# Patient Record
Sex: Female | Born: 1970 | Race: White | Hispanic: No | Marital: Married | Smoking: Never smoker
Health system: Southern US, Community
[De-identification: ages and names within clinical notes are randomized; demographics above are authoritative.]

## PROBLEM LIST (undated history)

## (undated) DIAGNOSIS — I480 Paroxysmal atrial fibrillation: Secondary | ICD-10-CM

## (undated) DIAGNOSIS — G509 Disorder of trigeminal nerve, unspecified: Principal | ICD-10-CM

## (undated) HISTORY — DX: Disorder of trigeminal nerve, unspecified: G50.9

## (undated) HISTORY — DX: Paroxysmal atrial fibrillation: I48.0

## (undated) HISTORY — PX: OTHER SURGICAL HISTORY: SHX169

## (undated) HISTORY — PX: TYMPANOSTOMY TUBE PLACEMENT: SHX32

---

## 1999-12-06 ENCOUNTER — Other Ambulatory Visit: Admission: RE | Admit: 1999-12-06 | Discharge: 1999-12-06 | Payer: Self-pay | Admitting: Obstetrics & Gynecology

## 2001-01-01 ENCOUNTER — Other Ambulatory Visit: Admission: RE | Admit: 2001-01-01 | Discharge: 2001-01-01 | Payer: Self-pay | Admitting: Obstetrics & Gynecology

## 2005-06-17 ENCOUNTER — Encounter: Admission: RE | Admit: 2005-06-17 | Discharge: 2005-06-17 | Payer: Self-pay | Admitting: Otolaryngology

## 2005-06-18 ENCOUNTER — Other Ambulatory Visit: Admission: RE | Admit: 2005-06-18 | Discharge: 2005-06-18 | Payer: Self-pay | Admitting: Family Medicine

## 2006-03-12 ENCOUNTER — Ambulatory Visit (HOSPITAL_COMMUNITY): Admission: RE | Admit: 2006-03-12 | Discharge: 2006-03-12 | Payer: Self-pay | Admitting: Certified Nurse Midwife

## 2006-03-19 ENCOUNTER — Ambulatory Visit (HOSPITAL_COMMUNITY): Admission: RE | Admit: 2006-03-19 | Discharge: 2006-03-19 | Payer: Self-pay | Admitting: Certified Nurse Midwife

## 2010-09-03 ENCOUNTER — Encounter: Payer: Self-pay | Admitting: Certified Nurse Midwife

## 2013-11-20 ENCOUNTER — Other Ambulatory Visit: Payer: Self-pay | Admitting: Family Medicine

## 2013-11-20 ENCOUNTER — Other Ambulatory Visit (HOSPITAL_COMMUNITY)
Admission: RE | Admit: 2013-11-20 | Discharge: 2013-11-20 | Disposition: A | Discharge status: Home / Self Care | Payer: 59 | Source: Ambulatory Visit | Attending: Family Medicine | Admitting: Family Medicine

## 2013-11-20 DIAGNOSIS — Z124 Encounter for screening for malignant neoplasm of cervix: Secondary | ICD-10-CM | POA: Insufficient documentation

## 2013-12-17 ENCOUNTER — Other Ambulatory Visit: Payer: Self-pay

## 2013-12-17 DIAGNOSIS — Z1231 Encounter for screening mammogram for malignant neoplasm of breast: Secondary | ICD-10-CM

## 2013-12-30 ENCOUNTER — Encounter (INDEPENDENT_AMBULATORY_CARE_PROVIDER_SITE_OTHER): Payer: Self-pay

## 2013-12-30 ENCOUNTER — Ambulatory Visit (INDEPENDENT_AMBULATORY_CARE_PROVIDER_SITE_OTHER): Payer: 59 | Admitting: Neurology

## 2013-12-30 ENCOUNTER — Encounter: Payer: Self-pay | Admitting: Neurology

## 2013-12-30 VITALS — BP 109/72 | HR 73 | Ht 69.0 in | Wt 158.0 lb

## 2013-12-30 DIAGNOSIS — G509 Disorder of trigeminal nerve, unspecified: Secondary | ICD-10-CM

## 2013-12-30 HISTORY — DX: Disorder of trigeminal nerve, unspecified: G50.9

## 2013-12-30 NOTE — Progress Notes (Signed)
Reason for visit: Right facial numbness  Kathryn Armstrong is a 43 y.o. female  History of present illness:  Kathryn Armstrong is a 43 year old right-handed white female with a history of right facial numbness that began in 2013 in October of that year. She indicated that she has had some numbness in the lower face on the right, but it has spread to go around her eye and into her forehead. She will have some right neck numbness at times as well, and at one point she had some numbness down into the upper shoulder on the right. The patient has not noted any weakness, speech changes, swallowing problems, or vision changes. She has not had any weakness of the extremities were numbness out into the arms or legs or problems with balance. She denies any problems controlling the bowels or the bladder. At times, the patient may have an uncomfortable sharp sensation that may be brought on by eating or coughing or sneezing. She has had gradual progression of the symptoms over time. She comes to this office for an evaluation.  Past Medical History  Diagnosis Date  . 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 Tympanostomy tube placement    . Reconstructive rhinoplasty  6440,3474    Family History  Problem Relation Age of Onset  . Colon cancer Father   . Heart disease Father   . Skin cancer Maternal Aunt   . Parkinsonism Paternal Grandfather     Social history:  reports that she has never smoked. She has never used smokeless tobacco. She reports that she drinks alcohol. She reports that she does not use illicit drugs.  Medications:  No current outpatient prescriptions on file prior to visit.   No current facility-administered medications on file prior to visit.     No Known Allergies  ROS:  Out of a complete 14 system review of symptoms, the patient complains only of the following symptoms, and all other reviewed systems are  negative.  Numbness of the face  Blood pressure 109/72, pulse 73, height 5\' 9"  (1.753 m), weight 158 lb (71.668 kg).  Physical Exam  General: The patient is alert and cooperative at the time of the examination.  Eyes: Pupils are equal, round, and reactive to light. Discs are flat bilaterally.  Neck: The neck is supple, no carotid bruits are noted.  Respiratory: The respiratory examination is clear.  Cardiovascular: The cardiovascular examination reveals a regular rate and rhythm, no obvious murmurs or rubs are noted.  Skin: Extremities are without significant edema.  Neurologic Exam  Mental status: The patient is alert and oriented x 3 at the time of the examination. The patient has apparent normal recent and remote memory, with an apparently normal attention span and concentration ability.  Cranial nerves: Facial symmetry is present. There is good sensation of the face to pinprick and soft touch on the left, decreased on the right. There is some decrease in pinprick sensation on the right neck is well. The strength of the facial muscles and the muscles to head turning and shoulder shrug are normal bilaterally. Speech is well enunciated, no aphasia or dysarthria is noted. Extraocular movements are full. Visual fields are full. The tongue is midline, and the patient has symmetric elevation of the soft palate. No obvious hearing deficits are noted.  Motor: The motor testing reveals 5 over 5 strength of all 4 extremities. Good symmetric motor tone is noted throughout.  Sensory: Sensory  testing is intact to pinprick, soft touch, vibration sensation, and position sense on all 4 extremities. No evidence of extinction is noted.  Coordination: Cerebellar testing reveals good finger-nose-finger and heel-to-shin bilaterally.  Gait and station: Gait is normal. Tandem gait is normal. Romberg is negative. No drift is seen.  Reflexes: Deep tendon reflexes are symmetric and normal bilaterally.  Toes are downgoing bilaterally.   Assessment/Plan:  1. Atypical facial pain, right  The patient has a history that is not typical for trigeminal neuralgia. She reports a true numbness of the right face and the right neck. The patient has some discomfort in the right face at times, but she does not believe that the discomfort is significant enough that she would want medications for it at this time. The patient will require a workup to include MRI evaluation of the brain with and without gadolinium enhancement to exclude demyelinating disease or external compression of the trigeminal nerve. The patient will followup through this office if needed.  Jill Alexanders MD 12/30/2013 7:56 PM  [facility] [address] [place], [place] [address]  Phone [phone] Fax [phone]

## 2014-01-07 ENCOUNTER — Ambulatory Visit
Admission: RE | Admit: 2014-01-07 | Discharge: 2014-01-07 | Disposition: A | Discharge status: Home / Self Care | Payer: 59 | Source: Ambulatory Visit

## 2014-01-07 DIAGNOSIS — Z1231 Encounter for screening mammogram for malignant neoplasm of breast: Secondary | ICD-10-CM

## 2014-01-11 ENCOUNTER — Other Ambulatory Visit: Payer: Self-pay | Admitting: Family Medicine

## 2014-01-11 DIAGNOSIS — R928 Other abnormal and inconclusive findings on diagnostic imaging of breast: Secondary | ICD-10-CM

## 2014-01-21 ENCOUNTER — Other Ambulatory Visit: Payer: Self-pay | Admitting: Family Medicine

## 2014-01-21 ENCOUNTER — Ambulatory Visit
Admission: RE | Admit: 2014-01-21 | Discharge: 2014-01-21 | Disposition: A | Discharge status: Home / Self Care | Payer: 59 | Source: Ambulatory Visit | Attending: Family Medicine | Admitting: Family Medicine

## 2014-01-21 DIAGNOSIS — R928 Other abnormal and inconclusive findings on diagnostic imaging of breast: Secondary | ICD-10-CM

## 2014-01-27 ENCOUNTER — Ambulatory Visit
Admission: RE | Admit: 2014-01-27 | Discharge: 2014-01-27 | Disposition: A | Discharge status: Home / Self Care | Payer: 59 | Source: Ambulatory Visit | Attending: Neurology | Admitting: Neurology

## 2014-01-27 DIAGNOSIS — G509 Disorder of trigeminal nerve, unspecified: Secondary | ICD-10-CM

## 2014-01-27 MED ORDER — GADOBENATE DIMEGLUMINE 529 MG/ML IV SOLN
15.0000 mL | Freq: Once | INTRAVENOUS | Status: AC | PRN
  Administered 2014-01-27: 15 mL via INTRAVENOUS

## 2014-01-29 ENCOUNTER — Telehealth: Payer: Self-pay | Admitting: Neurology

## 2014-01-29 DIAGNOSIS — D333 Benign neoplasm of cranial nerves: Secondary | ICD-10-CM

## 2014-01-29 NOTE — Telephone Encounter (Signed)
I called patient. 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 This was there in 2006, never called. The tumor has grown slowly over time. I discussed this with the patient, and we will make a referral for a neurosurgical evaluation.    MRI brain 01/28/14:  IMPRESSION:  Abnormal MRI brain (with and without) demonstrating:  1. 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 It appears multi-lobulated, with segments extending inferiorly and superiorly. There is some extension towards the right gasserian ganglion and right cavernous sinus. There is extension towards the cisternal segment of the 7th/8th cranial nerve complex. There is mass effect upon the right midbrain and right pons. 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  2. Compared to MRI on 06/17/05, the right cerebellopontine angle mass has grown in size. In retrospect, this lesion was present in the prior study, but smaller in size (1.0x0.5cm in prior study).

## 2014-02-01 ENCOUNTER — Telehealth: Payer: Self-pay | Admitting: Neurology

## 2014-02-01 ENCOUNTER — Encounter: Payer: Self-pay | Admitting: Neurology

## 2014-02-01 DIAGNOSIS — D329 Benign neoplasm of meninges, unspecified: Secondary | ICD-10-CM

## 2014-02-01 NOTE — Telephone Encounter (Signed)
Pt is requesting another referral, she does not want to have just one referral. Pt states with this involving a brain tumor and going into surgery she does not want to just have one referral. Please call pt back concerning this matter. Thanks

## 2014-02-01 NOTE — Telephone Encounter (Signed)
Please see prior telephone note, I have called the patient, set up a referral to [facility], she are has an appointment at [facility]. I cautioned her about seeing too many doctors for the same problem, this may generate confusion concerning the appropriate method of treatment.

## 2014-02-01 NOTE — Telephone Encounter (Signed)
I called patient. The patient is going to be seen at [facility], I'll set up referral to [facility] as per her request. She will be seen locally through [facility] as well.

## 2014-02-01 NOTE — Telephone Encounter (Signed)
Referral to [facility] was made patient is scheduled for 02/04/14. She called this morning stating she wants multiple referrals. She sent you a my chart message with several doctors. She says [facility] is  Vascular neurosurgeon and that's not what she needs. I have sent to other referral (1) [facility] Dr. Marjory Sneddon and (2) Renee Harder @ [place].

## 2014-02-03 NOTE — Telephone Encounter (Signed)
Pt called back states she needs Dr. Jannifer Franklin to send a referral to [facility]. They have already recd information from her insurance and request we call Alphia Moh at [phone] regarding setting up an apt w/Dr. Alycia Patten. Any questions please call pt concerning this matter or call Brandy concerning the referral. Thanks

## 2014-02-04 ENCOUNTER — Encounter: Payer: Self-pay | Admitting: Neurology

## 2014-02-04 NOTE — Telephone Encounter (Signed)
Kathryn Armstrong at PCP requesting a call with appointment time and date for Kathryn Armstrong and Kathryn Armstrong.  She needing to do referral for Insurance purposes.Marland KitchenMarland KitchenPlease call her @ [phone].

## 2014-02-04 NOTE — Telephone Encounter (Signed)
Called patient she has already been seen by [facility] on yesterday. She has appointment with Dr.Tatter @ [facility] on 02/18/14.  Spoke with Beverlee Nims at PCP to let her know of Dr. Renee Harder [facility]. Patient  Is now satisfied with referral status.

## 2014-05-28 ENCOUNTER — Other Ambulatory Visit: Payer: Self-pay

## 2014-10-27 ENCOUNTER — Other Ambulatory Visit (HOSPITAL_COMMUNITY): Payer: Self-pay | Admitting: Family Medicine

## 2014-10-27 DIAGNOSIS — Z1231 Encounter for screening mammogram for malignant neoplasm of breast: Secondary | ICD-10-CM

## 2014-11-15 ENCOUNTER — Other Ambulatory Visit: Payer: Self-pay | Admitting: Family Medicine

## 2014-11-15 DIAGNOSIS — R921 Mammographic calcification found on diagnostic imaging of breast: Secondary | ICD-10-CM

## 2015-01-11 ENCOUNTER — Other Ambulatory Visit: Payer: Self-pay | Admitting: Family Medicine

## 2015-01-11 ENCOUNTER — Ambulatory Visit
Admission: RE | Admit: 2015-01-11 | Discharge: 2015-01-11 | Disposition: A | Discharge status: Home / Self Care | Payer: BLUE CROSS/BLUE SHIELD | Source: Ambulatory Visit | Attending: Family Medicine | Admitting: Family Medicine

## 2015-01-11 ENCOUNTER — Ambulatory Visit
Admission: RE | Admit: 2015-01-11 | Discharge: 2015-01-11 | Disposition: A | Discharge status: Home / Self Care | Payer: 59 | Source: Ambulatory Visit | Attending: Family Medicine | Admitting: Family Medicine

## 2015-01-11 DIAGNOSIS — R921 Mammographic calcification found on diagnostic imaging of breast: Secondary | ICD-10-CM

## 2015-01-11 DIAGNOSIS — N6310 Unspecified lump in the right breast, unspecified quadrant: Secondary | ICD-10-CM

## 2015-01-12 ENCOUNTER — Ambulatory Visit (HOSPITAL_COMMUNITY): Payer: 59

## 2015-02-22 ENCOUNTER — Ambulatory Visit: Payer: BLUE CROSS/BLUE SHIELD | Attending: Family Medicine | Admitting: Physical Therapy

## 2015-02-22 DIAGNOSIS — H832X9 Labyrinthine dysfunction, unspecified ear: Secondary | ICD-10-CM | POA: Insufficient documentation

## 2015-02-22 DIAGNOSIS — H532 Diplopia: Secondary | ICD-10-CM | POA: Diagnosis present

## 2015-02-22 DIAGNOSIS — R269 Unspecified abnormalities of gait and mobility: Secondary | ICD-10-CM | POA: Insufficient documentation

## 2015-02-23 ENCOUNTER — Encounter: Payer: Self-pay | Admitting: Physical Therapy

## 2015-02-23 NOTE — Therapy (Signed)
[facility] [address] [place], [place], [address] Phone: [phone]   Fax:  [phone]  Physical Therapy Evaluation  Patient Details  Name: Avon Molock MRN: [id number] Date of Birth: 1970/10/15 Referring Provider:  Kelton Pillar, MD  Encounter Date: 02/22/2015      PT End of Session - 02/23/15 2126    Visit Number 1   Number of Visits 1  pt reports she is going to be going to [place] for therapy   PT Start Time 1103   PT Stop Time 1146   PT Time Calculation (min) 43 min      Past Medical History  Diagnosis Date  . 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 Tympanostomy tube placement    . Reconstructive rhinoplasty  2863,8177    There were no vitals filed for this visit.  Visit Diagnosis:  Diplopia - Plan: PT plan of care cert/re-cert  Balance problem due to vestibular dysfunction, unspecified laterality - Plan: PT plan of care cert/re-cert  Abnormality of gait - Plan: PT plan of care cert/re-cert      Subjective Assessment - 02/23/15 0730    Subjective Pt. 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 is wearing an eye patch over L and states her chief c/o is diplopia; pt. wishes to address visual deficits and has questions regarding when and how often she should wear her eye patch;  pt states she is waiting to hear back from [facility] as to whether she may be going there for therapy but came to this facility due to referral being made prior to her leaving hospital. Pt states she feels that balance has improved and is now decreased mostly due to her double vision. Pt. states she wants to address visual deficits as soon as possible and later address balance deficits after visual problems addressed (recommend OT for visual deficits)  eye    Patient Stated Goals Improve vision - address visual deficits and decrease diplopia   Currently in Pain? No/denies            [facility] PT Assessment - 02/23/15 0001    Assessment   Medical Diagnosis Meningioma (CPA tumor)   Onset Date/Surgical Date 02/16/15   Balance Screen   Has the patient fallen in the past 6 months No   Has the patient had a decrease in activity level because of a fear of falling?  No   Is the patient reluctant to leave their home because of a fear of falling?  No   Prior Function   Level of Independence Independent with household mobility without device;Independent with community mobility without device   Ambulation/Gait   Ambulation/Gait Yes   Ambulation Distance (Feet) 100 Feet   Assistive device --  None   Gait Pattern Ataxic     L eye is not symmetrical with R eye and does not appear to converge as R eye does; unable to assess dynamic  visual acuity due to double vision Pt. Appears to have vestibular deficits in addition to diplopia but wishes to first address her visual problem as she states  This is her chief c/o   Pt. Amb. Modified independently without device but occasionally holding wall due to unsteady gait which pt attributes to her Double vision       I talked to pt. on 02-23-15 and she states she is going to [facility] for therapy for her visual deficits            PT Education - 02/23/15 2125    Education provided Yes   Education Details Instructed pt to cont with saccades exercises - horizontal and vertical   Person(s) Educated Patient   Methods Explanation;Demonstration   Comprehension Verbalized understanding;Returned  demonstration                    Plan - 02/23/15 2127    Clinical Impression Statement Pt. presents with chief c/o diplopia with use of eye patch over L eye; unsteady gait noted but pt states this is due to double vision; pt. appears to have vestibular deficits in addition to visual deficits; pt reported on 02-23-15 (day after eval) that she will be going to [facility] for therapy                                                                                                                                            PT Frequency 1x / week  eval only per pt's request   PT Next Visit Plan N/A - eval only         Problem List Patient Active Problem List   Diagnosis Date Noted  . 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

## 2015-04-26 ENCOUNTER — Telehealth: Payer: Self-pay | Admitting: Cardiovascular Disease

## 2015-04-26 NOTE — Telephone Encounter (Signed)
Received records from [facility] for appointment with Dr Oval Linsey on 05/11/15.  Records given to [facility] (medical records) for Dr Blenda Mounts schedule on 05/11/15. lp

## 2015-05-10 NOTE — Progress Notes (Signed)
Cardiology Office Note   Date:  05/11/2015   ID:  Kathryn Armstrong, DOB Jun 08, 1971, MRN [id number]  PCP:  Osborne Casco, MD  Cardiologist:   Sharol Harness, MD   Chief Complaint  Patient presents with  . Establish Care  . Dizziness    DUE TO NERVE PROBLEMS      History of Present Illness: Kathryn Armstrong is a 44 y.o. female with atrial fibrillation and a meningioma who presents for an evaluation of peri-operative atrial fibrillation.  Kathryn Armstrong had a meningioma removed in July. During the surgery she had a run of atrial fibrillation. It is unclear how this was managed from the notes, however she does not remember having any new medications at discharge and does not think that she underwent DC cardioversion. She had an echo that showed normal systolic function and mild enlargement of the left atrium. Her thyroid function and electrolytes were stable. She was not anemic. She did not take any rate control or blood thinning agents at discharge. Since she left the hospital she has not noted any palpitations, chest pain, shortness of breath, lightheadedness, dizziness, lower extremity edema, orthopnea or PND. 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  Kathryn Armstrong does not get any formal exercise. She has a hard time running due to diplopia. She does try to walk regularly and has no symptoms when trying to lift her 90 pound child. She is even able to give them piggyback rides while walking up stairs.  Kathryn Armstrong is scheduled for a breast lumpectomy on October 10. She was asked to be evaluated by cardiology prior to this procedure.    Past Medical History  Diagnosis Date  . 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 Episodic atrial fibrillation     perioperative, 02/2015    Past Surgical History  Procedure Laterality Date  . Tympanostomy tube placement    . Reconstructive  rhinoplasty  3212,2482     No current outpatient prescriptions on file.   No current facility-administered medications for this visit.    Allergies:   Review of patient's allergies indicates no known allergies.    Social History:  The patient  reports that she has never smoked. She has never used smokeless tobacco. She reports that she drinks alcohol. She reports that she does not use illicit drugs.   Family History:  The patient's family history includes Colon cancer in her father; Heart disease in her father; Parkinsonism in her paternal grandfather; Skin cancer in her maternal aunt.    ROS:  Please see the history of present illness.   Otherwise, review of systems are positive for none.   All other systems are reviewed and negative.    PHYSICAL EXAM: VS:  BP 106/80 mmHg  Pulse 77  Ht 5\' 9"  (1.753 m)  Wt 68.04 kg (150 lb)  BMI 22.14 kg/m2 , BMI Body mass index is 22.14 kg/(m^2). GENERAL:  Well appearing HEENT:  Pupils equal round and reactive, fundi not visualized, oral mucosa unremarkable NECK:  No jugular venous distention, waveform within normal limits, carotid upstroke brisk and symmetric, no bruits, no thyromegaly LYMPHATICS:  No cervical adenopathy LUNGS:  Clear to auscultation bilaterally HEART:  RRR.  PMI not displaced or sustained,S1 and S2 within normal limits, no S3, no S4, no clicks, no rubs, no murmurs ABD:  Flat, positive bowel sounds normal in frequency in pitch, no bruits, no rebound, no  guarding, no midline pulsatile mass, no hepatomegaly, no splenomegaly EXT:  2 plus pulses throughout, no edema, no cyanosis no clubbing SKIN:  No rashes no nodules NEURO:  Cranial nerves II through XII grossly intact, motor grossly intact throughout PSYCH:  Cognitively intact, oriented to person place and time    EKG:  EKG is ordered today. The ekg ordered today demonstrates sinus rhythm at 77 bpm.  Low voltage in the precordial leads.   Recent Labs: No results found for  requested labs within last 365 days.    Lipid Panel No results found for: CHOL, TRIG, HDL, CHOLHDL, VLDL, LDLCALC, LDLDIRECT    Wt Readings from Last 3 Encounters:  05/11/15 68.04 kg (150 lb)  12/30/13 71.668 kg (158 lb)    Echo 02/17/15:  LVEF >55%.  No wall motion abnormalities.  Trivial MR, TR, PR.  LA mildly enlarged.    TSH 0.58   Other studies Reviewed: Additional studies/ records that were reviewed today include: . Review of the above records demonstrates:  Please see elsewhere in the note.     ASSESSMENT AND PLAN:  # Peri-operative atrial fibrillation: [name] had an episode of perioperative atrial fibrillation. It does not appear as though she's had any recurrent symptoms since that time. She was not on anticoagulation presumably due to her low CHADS2-VASc score (1 for female gender) and high bleeding risk after brain surgery. Given that all her labs were within normal limits, we will not repeat them at this time. She does not need any further evaluation and she is asymptomatic and had normal labs with no clear recurrent episodes. Should she have recurrent atrial fibrillation with her next surgery, we will be happy to assist with her management.   Current medicines are reviewed at length with the patient today.  The patient does not have concerns regarding medicines.  The following changes have been made:  no change  Labs/ tests ordered today include:  No orders of the defined types were placed in this encounter.     Disposition:   FU with Kathryn C. Oval Linsey, MD as needed.    Signed, Sharol Harness, MD  05/11/2015 8:59 AM    [place]

## 2015-05-11 ENCOUNTER — Encounter: Payer: Self-pay | Admitting: Cardiovascular Disease

## 2015-05-11 ENCOUNTER — Ambulatory Visit (INDEPENDENT_AMBULATORY_CARE_PROVIDER_SITE_OTHER): Payer: BLUE CROSS/BLUE SHIELD | Admitting: Cardiovascular Disease

## 2015-05-11 VITALS — BP 106/80 | HR 77 | Ht 69.0 in | Wt 150.0 lb

## 2015-05-11 DIAGNOSIS — I480 Paroxysmal atrial fibrillation: Secondary | ICD-10-CM | POA: Diagnosis not present

## 2015-05-11 NOTE — Patient Instructions (Signed)
Dr Lanare recommends that you follow-up with her as needed. 

## 2015-12-27 ENCOUNTER — Other Ambulatory Visit: Payer: Self-pay | Admitting: Orthopaedic Surgery

## 2015-12-27 DIAGNOSIS — M25512 Pain in left shoulder: Secondary | ICD-10-CM

## 2016-01-01 ENCOUNTER — Ambulatory Visit
Admission: RE | Admit: 2016-01-01 | Discharge: 2016-01-01 | Disposition: A | Discharge status: Home / Self Care | Payer: BLUE CROSS/BLUE SHIELD | Source: Ambulatory Visit | Attending: Orthopaedic Surgery | Admitting: Orthopaedic Surgery

## 2016-01-01 DIAGNOSIS — M25512 Pain in left shoulder: Secondary | ICD-10-CM

## 2016-07-16 ENCOUNTER — Ambulatory Visit (INDEPENDENT_AMBULATORY_CARE_PROVIDER_SITE_OTHER): Payer: BLUE CROSS/BLUE SHIELD | Admitting: Orthopaedic Surgery

## 2016-07-16 ENCOUNTER — Encounter (INDEPENDENT_AMBULATORY_CARE_PROVIDER_SITE_OTHER): Payer: Self-pay | Admitting: Orthopaedic Surgery

## 2016-07-16 DIAGNOSIS — M7592 Shoulder lesion, unspecified, left shoulder: Secondary | ICD-10-CM | POA: Diagnosis not present

## 2016-07-16 DIAGNOSIS — M7542 Impingement syndrome of left shoulder: Secondary | ICD-10-CM

## 2016-07-16 MED ORDER — BUPIVACAINE HCL 0.5 % IJ SOLN
3.0000 mL | INTRAMUSCULAR | Status: AC | PRN
  Administered 2016-07-16: 3 mL via INTRA_ARTICULAR

## 2016-07-16 MED ORDER — METHYLPREDNISOLONE ACETATE 40 MG/ML IJ SUSP
40.0000 mg | INTRAMUSCULAR | Status: AC | PRN
  Administered 2016-07-16: 40 mg via INTRA_ARTICULAR

## 2016-07-16 MED ORDER — LIDOCAINE HCL 1 % IJ SOLN
3.0000 mL | INTRAMUSCULAR | Status: AC | PRN
  Administered 2016-07-16: 3 mL

## 2016-07-16 NOTE — Progress Notes (Signed)
Office Visit Note   Patient: Kathryn Armstrong           Date of Birth: 06-Nov-1970           MRN: [id number] Visit Date: 07/16/2016              Requested by: Kelton Pillar, MD [address] [place], [place] [address] PCP: Osborne Casco, MD   Assessment & Plan: Visit Diagnoses: No diagnosis found.  Plan: MRI of the left shoulder from May was again reviewed which shows moderate tendinopathy of the supraspinatus tendon with partial articular surface tear. Also was showing signs of synovitis and acromioclavicular arthropathy with type II acromion. 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 I provided her with a subacromial injection like to see her back in 6 weeks to see if she is doing. We did briefly discuss arthroscopic evaluation and debridement  Follow-Up Instructions: Return in about 6 weeks (around 08/27/2016).   Orders:  No orders of the defined types were placed in this encounter.  No orders of the defined types were placed in this encounter.     Procedures: Large Joint Inj Date/Time: 07/16/2016 2:16 PM Performed by: Leandrew Koyanagi Authorized by: Leandrew Koyanagi   Consent Given by:  Patient Timeout: prior to procedure the correct patient, procedure, and site was verified   Location:  Shoulder Site:  L subacromial bursa Prep: patient was prepped and draped in usual sterile fashion   Needle Size:  22 G Approach:  Posterior Ultrasound Guidance: No   Fluoroscopic Guidance: No   Arthrogram: No   Medications:  3 mL lidocaine 1 %; 3 mL bupivacaine 0.5 %; 40 mg methylPREDNISolone acetate 40 MG/ML     Clinical Data: No additional findings.   Subjective: Chief Complaint  Patient presents with  . Left Shoulder - Pain, Follow-up    Patient follows up today for continued left shoulder pain. She had double mastectomies for weeks ago for breast cancer. She is doing well. She  complains of continued pain with certain movements like elevation of her arm and reaching back. Pain will sometimes radiate into the neck.    Review of Systems  Constitutional: Negative.   HENT: Negative.   Eyes: Negative.   Respiratory: Negative.   Cardiovascular: Negative.   Endocrine: Negative.   Musculoskeletal: Negative.   Neurological: Negative.   Hematological: Negative.   Psychiatric/Behavioral: Negative.   All other systems reviewed and are negative.    Objective: Vital Signs: There were no vitals taken for this visit.  Physical Exam Well-nourished no acute distress alert and 3 Ortho Exam Exam the left shoulder shows positive empty can testing. Also has pain with mild weakness of infraspinatus testing. Positive cross adduction and positive Hawkins test. Negative drop arm test. Range of motion is normal. Specialty Comments:  No specialty comments available.  Imaging: No results found.   PMFS History: Patient Active Problem List   Diagnosis Date Noted  . 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 Episodic atrial fibrillation ([place])    perioperative, 02/2015  . 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 Colon cancer Father   . Heart disease Father   . Skin cancer Maternal Aunt   . Parkinsonism Paternal Grandfather     Past Surgical History:  Procedure Laterality Date  . RECONSTRUCTIVE RHINOPLASTY  [id number]  . TYMPANOSTOMY TUBE PLACEMENT     Social History   Occupational History  . MARKETING     [employer]   Social History Main Topics  . Smoking status: Never Smoker  . Smokeless tobacco: Never Used  . Alcohol use Yes     Comment: seldom  . Drug use: No  . Sexual activity: Not on file

## 2016-07-17 ENCOUNTER — Ambulatory Visit (INDEPENDENT_AMBULATORY_CARE_PROVIDER_SITE_OTHER): Payer: Self-pay | Admitting: Orthopaedic Surgery

## 2016-07-31 ENCOUNTER — Telehealth (INDEPENDENT_AMBULATORY_CARE_PROVIDER_SITE_OTHER): Payer: Self-pay | Admitting: Orthopaedic Surgery

## 2016-07-31 NOTE — Telephone Encounter (Signed)
RECEIVED VM FROM PATIENT STATING WANTS RECORDS BE SENT TO [facility].  I CALLED HER BACK AND LEFT HER MESSAGE STATING WE NEED SIGNED RELEASE FORM AND THAT WE CAN FAX OR MAIL ONE TO HER OR SHE CAN COME BY THE OFFICE.

## 2016-11-24 IMAGING — MR MR SHOULDER*L* W/O CM
4 of 5 series · 14 of 40 positions shown · non-contrast
Comparison: None.

CLINICAL DATA: Increasing left shoulder pain for several months.

EXAM:
MRI OF THE LEFT SHOULDER WITHOUT CONTRAST
TECHNIQUE: Multiplanar, multisequence MR imaging of the shoulder was performed.
No intravenous contrast was administered.

[Series 8: T2 fat-sat · axial · left · 3.0mm · 0.44mm/px · z∈[-37,+17]mm · 3 of 21 slices shown (1 of 3)]
[im 3/21]
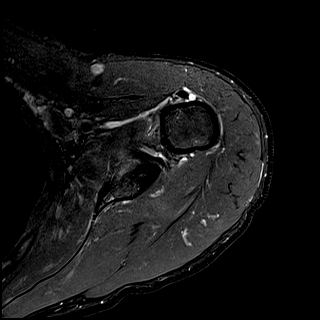
[im 12/21]
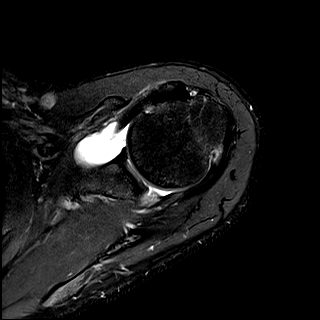
[im 18/21]
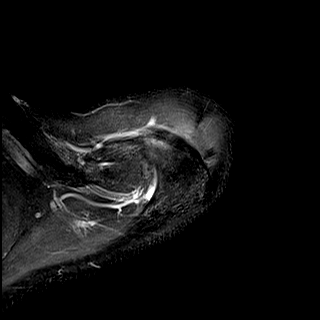

[Series 9: PD · oblique · left · 3.0mm · 0.18mm/px · 5 of 19 slices shown]
[im 1/19]
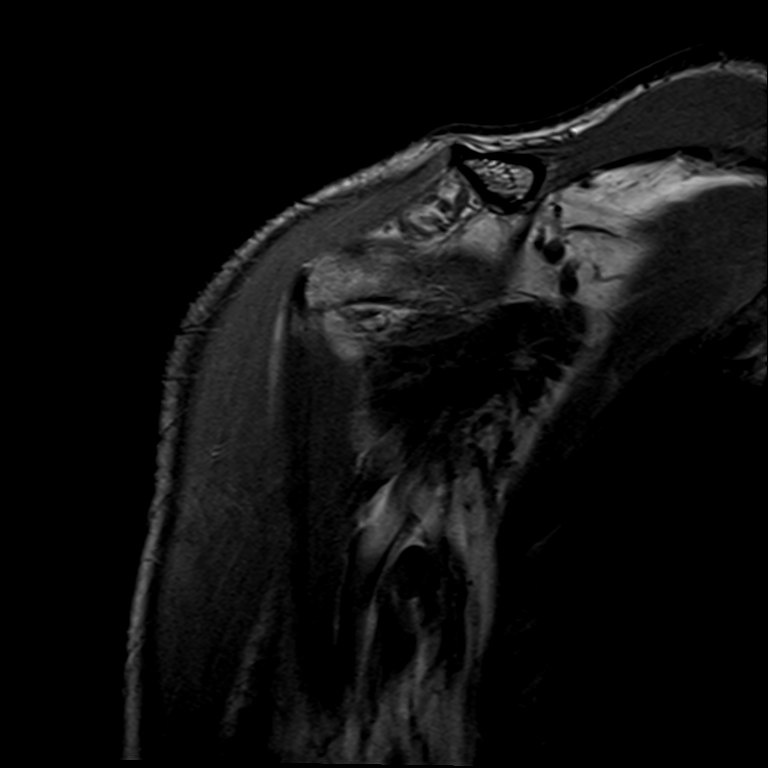
[im 3/19]
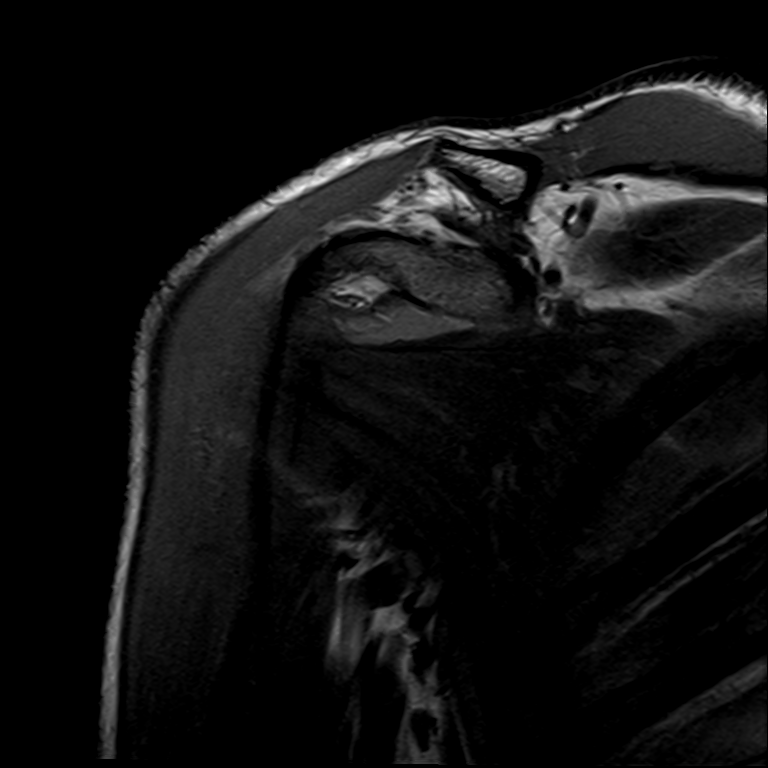
[im 6/19]
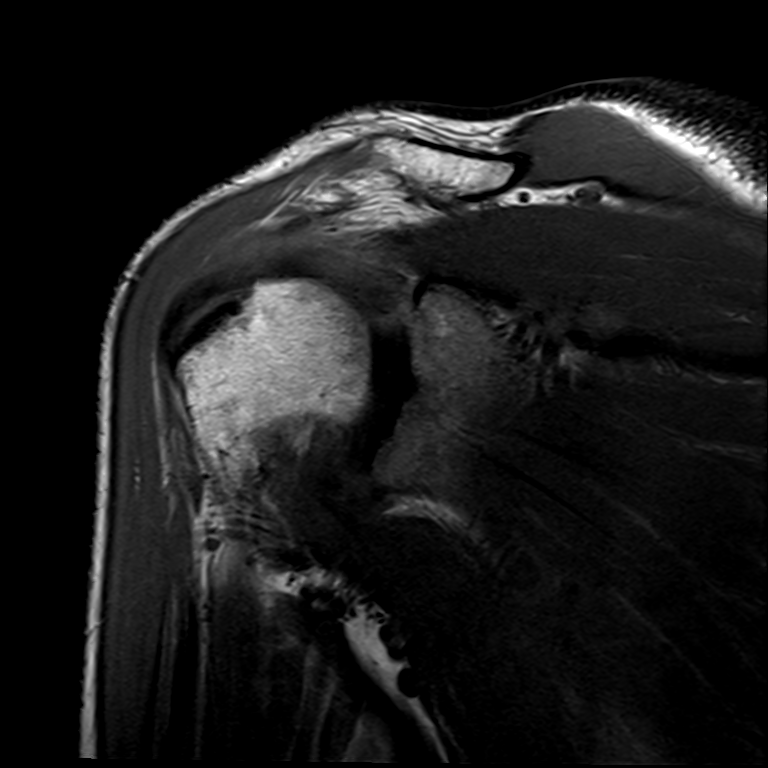
[im 11/19]
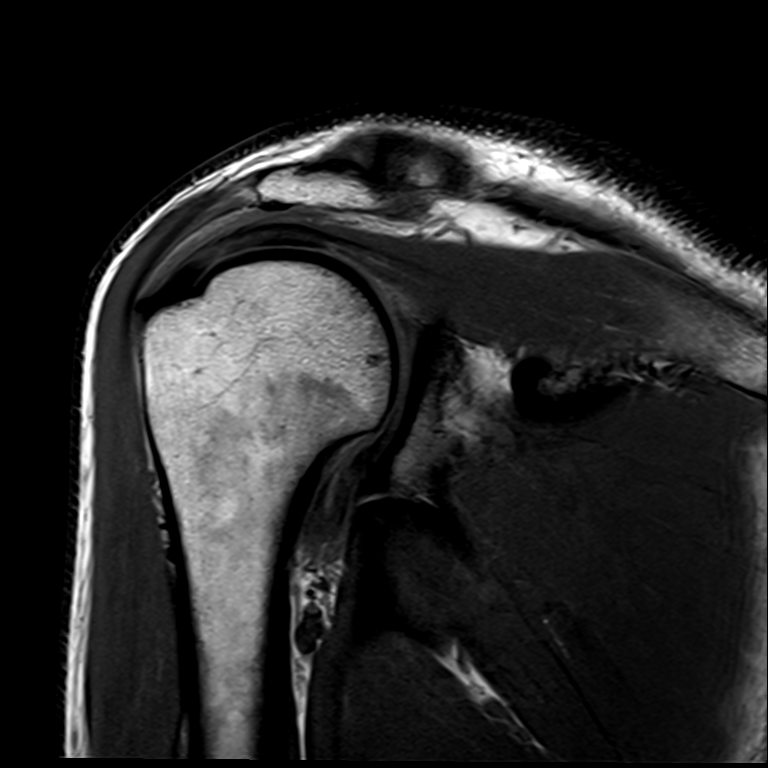
[im 16/19]
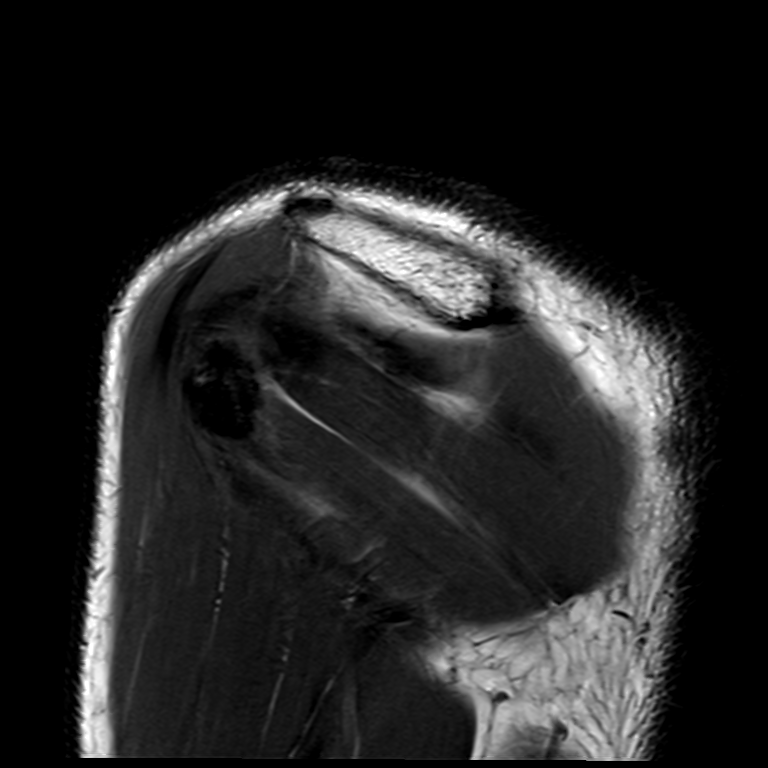

[Series 10: T2 fat-sat · oblique · left · 3.0mm · 0.22mm/px · 3 of 19 slices shown (2 of 3)]
[im 3/19]
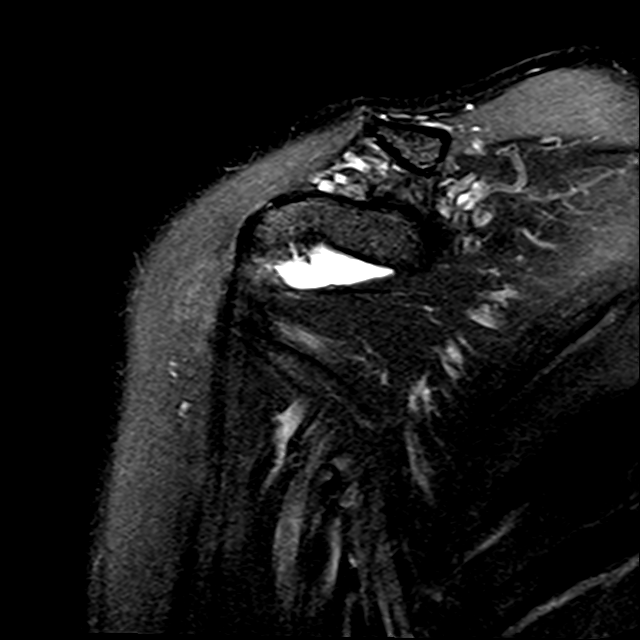
[im 11/19]
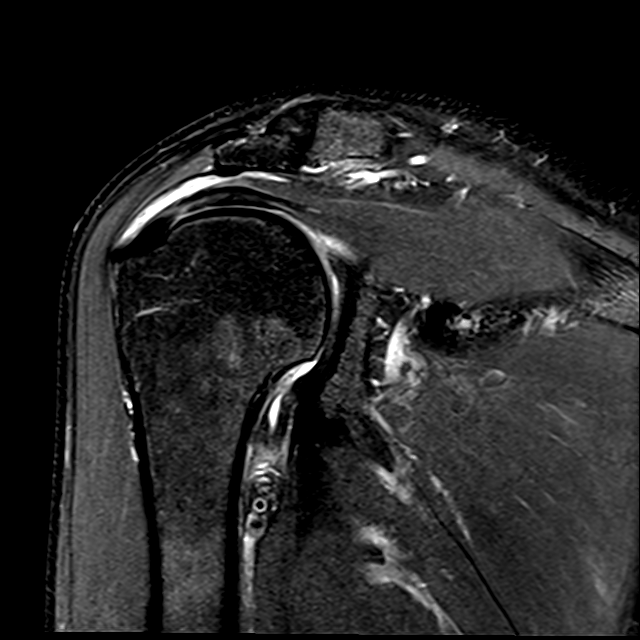
[im 16/19]
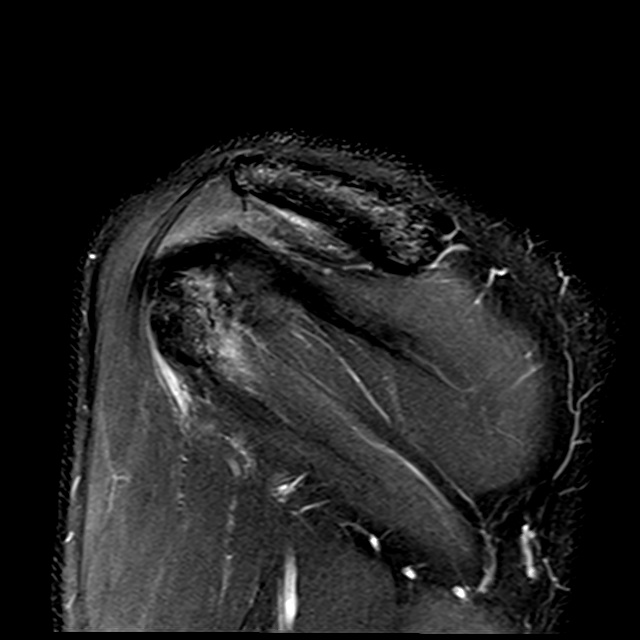

[Series 11: T2 fat-sat · oblique · left · 3.0mm · 0.44mm/px · 3 of 21 slices shown (3 of 3)]
[im 3/21]
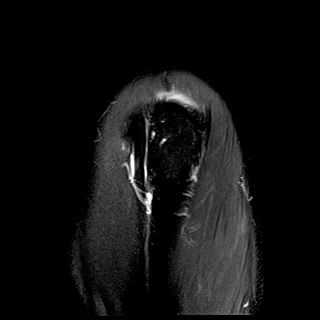
[im 12/21]
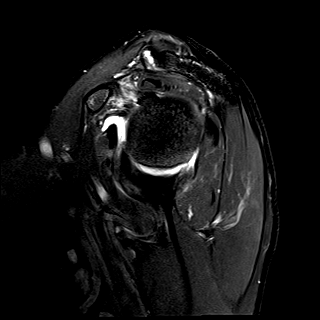
[im 18/21]
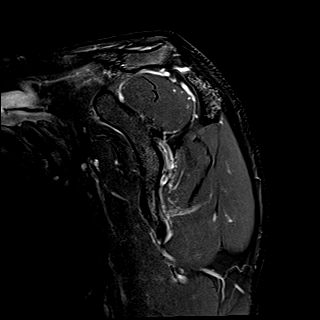

[14 of 40 positions shown; findings below may reference images not displayed]

FINDINGS: Rotator cuff: Moderate supraspinatus tendinopathy with partial
thickness articular surface tearing of the supraspinatus anteriorly
adjacent to the rotator interval and also posteriorly. Mild
subscapularis tendinopathy.

Muscles:  Unremarkable

Biceps long head:  Unremarkable

Acromioclavicular Joint: Mild degenerative AC joint arthropathy with
associated spurring and edema. Subacromial morphology is type 2
(curved). Abnormal fluid in the subacromial subdeltoid bursa.

Glenohumeral Joint: Mild degenerative articular cartilage thinning
along the humeral head. Thickening of the inferior glenohumeral
ligament with minimal adjacent edema, image [DATE]. Thickened
coracohumeral ligament, image [DATE], with edema signal along the
rotator interval on image [DATE] suggesting synovitis.

Labrum: Degenerative signal in the posterior labrum but without a
definite tear.

Bones: No significant extra-articular osseous abnormalities
identified.
IMPRESSION: 1. Moderate supraspinatus tendinopathy with partial thickness
articular surface tearing of both along the anterior leading edge
and posteriorly. Mild subscapularis tendinopathy.
2. Subacromial subdeltoid bursitis.
3. Synovitis in the rotator interval with mild thickening of the
axillary pouch. These are findings which can be associated with
adhesive capsulitis.
4. Degenerative signal in the posterior labrum but without a
definite tear.

## 2018-08-22 DIAGNOSIS — M25512 Pain in left shoulder: Secondary | ICD-10-CM | POA: Diagnosis not present

## 2018-08-22 DIAGNOSIS — M25552 Pain in left hip: Secondary | ICD-10-CM | POA: Diagnosis not present

## 2018-08-22 DIAGNOSIS — M25551 Pain in right hip: Secondary | ICD-10-CM | POA: Diagnosis not present

## 2018-08-22 DIAGNOSIS — M25511 Pain in right shoulder: Secondary | ICD-10-CM | POA: Diagnosis not present

## 2018-09-17 DIAGNOSIS — Z780 Asymptomatic menopausal state: Secondary | ICD-10-CM | POA: Diagnosis not present

## 2018-09-17 DIAGNOSIS — M8580 Other specified disorders of bone density and structure, unspecified site: Secondary | ICD-10-CM | POA: Diagnosis not present

## 2018-09-17 DIAGNOSIS — Z170 Estrogen receptor positive status [ER+]: Secondary | ICD-10-CM | POA: Diagnosis not present

## 2018-09-17 DIAGNOSIS — Z79899 Other long term (current) drug therapy: Secondary | ICD-10-CM | POA: Diagnosis not present

## 2018-09-17 DIAGNOSIS — C50411 Malignant neoplasm of upper-outer quadrant of right female breast: Secondary | ICD-10-CM | POA: Diagnosis not present

## 2018-09-26 DIAGNOSIS — M25551 Pain in right hip: Secondary | ICD-10-CM | POA: Diagnosis not present

## 2018-09-26 DIAGNOSIS — M25512 Pain in left shoulder: Secondary | ICD-10-CM | POA: Diagnosis not present

## 2018-09-26 DIAGNOSIS — M25552 Pain in left hip: Secondary | ICD-10-CM | POA: Diagnosis not present

## 2018-09-26 DIAGNOSIS — M25511 Pain in right shoulder: Secondary | ICD-10-CM | POA: Diagnosis not present

## 2019-01-29 DIAGNOSIS — Z7189 Other specified counseling: Secondary | ICD-10-CM | POA: Diagnosis not present

## 2019-01-29 DIAGNOSIS — R21 Rash and other nonspecific skin eruption: Secondary | ICD-10-CM | POA: Diagnosis not present

## 2019-02-16 DIAGNOSIS — G43009 Migraine without aura, not intractable, without status migrainosus: Secondary | ICD-10-CM | POA: Diagnosis not present

## 2019-03-17 DIAGNOSIS — Z170 Estrogen receptor positive status [ER+]: Secondary | ICD-10-CM | POA: Diagnosis not present

## 2019-03-17 DIAGNOSIS — C50411 Malignant neoplasm of upper-outer quadrant of right female breast: Secondary | ICD-10-CM | POA: Diagnosis not present

## 2019-05-04 DIAGNOSIS — H524 Presbyopia: Secondary | ICD-10-CM | POA: Diagnosis not present

## 2019-05-04 DIAGNOSIS — H5213 Myopia, bilateral: Secondary | ICD-10-CM | POA: Diagnosis not present

## 2019-05-04 DIAGNOSIS — D434 Neoplasm of uncertain behavior of spinal cord: Secondary | ICD-10-CM | POA: Diagnosis not present

## 2019-05-20 DIAGNOSIS — R32 Unspecified urinary incontinence: Secondary | ICD-10-CM | POA: Diagnosis not present

## 2019-05-20 DIAGNOSIS — Z01419 Encounter for gynecological examination (general) (routine) without abnormal findings: Secondary | ICD-10-CM | POA: Diagnosis not present

## 2019-06-30 DIAGNOSIS — D329 Benign neoplasm of meninges, unspecified: Secondary | ICD-10-CM | POA: Diagnosis not present

## 2019-06-30 DIAGNOSIS — D320 Benign neoplasm of cerebral meninges: Secondary | ICD-10-CM | POA: Diagnosis not present

## 2019-09-22 DIAGNOSIS — D329 Benign neoplasm of meninges, unspecified: Secondary | ICD-10-CM | POA: Diagnosis not present

## 2019-09-22 DIAGNOSIS — M8589 Other specified disorders of bone density and structure, multiple sites: Secondary | ICD-10-CM | POA: Diagnosis not present

## 2019-09-22 DIAGNOSIS — Z8739 Personal history of other diseases of the musculoskeletal system and connective tissue: Secondary | ICD-10-CM | POA: Diagnosis not present

## 2019-09-22 DIAGNOSIS — C50411 Malignant neoplasm of upper-outer quadrant of right female breast: Secondary | ICD-10-CM | POA: Diagnosis not present

## 2019-09-22 DIAGNOSIS — Z79810 Long term (current) use of selective estrogen receptor modulators (SERMs): Secondary | ICD-10-CM | POA: Diagnosis not present

## 2019-09-22 DIAGNOSIS — Z170 Estrogen receptor positive status [ER+]: Secondary | ICD-10-CM | POA: Diagnosis not present

## 2019-09-22 DIAGNOSIS — Z5181 Encounter for therapeutic drug level monitoring: Secondary | ICD-10-CM | POA: Diagnosis not present

## 2020-03-22 DIAGNOSIS — Z8739 Personal history of other diseases of the musculoskeletal system and connective tissue: Secondary | ICD-10-CM | POA: Diagnosis not present

## 2020-03-22 DIAGNOSIS — C50411 Malignant neoplasm of upper-outer quadrant of right female breast: Secondary | ICD-10-CM | POA: Diagnosis not present

## 2020-03-22 DIAGNOSIS — Z5181 Encounter for therapeutic drug level monitoring: Secondary | ICD-10-CM | POA: Diagnosis not present

## 2020-03-22 DIAGNOSIS — Z170 Estrogen receptor positive status [ER+]: Secondary | ICD-10-CM | POA: Diagnosis not present

## 2020-03-22 DIAGNOSIS — Z79810 Long term (current) use of selective estrogen receptor modulators (SERMs): Secondary | ICD-10-CM | POA: Diagnosis not present

## 2020-03-31 DIAGNOSIS — F5101 Primary insomnia: Secondary | ICD-10-CM | POA: Diagnosis not present

## 2020-03-31 DIAGNOSIS — D429 Neoplasm of uncertain behavior of meninges, unspecified: Secondary | ICD-10-CM | POA: Diagnosis not present

## 2020-03-31 DIAGNOSIS — G2581 Restless legs syndrome: Secondary | ICD-10-CM | POA: Diagnosis not present

## 2020-03-31 DIAGNOSIS — G519 Disorder of facial nerve, unspecified: Secondary | ICD-10-CM | POA: Diagnosis not present

## 2020-03-31 DIAGNOSIS — G43009 Migraine without aura, not intractable, without status migrainosus: Secondary | ICD-10-CM | POA: Diagnosis not present

## 2020-04-26 DIAGNOSIS — N924 Excessive bleeding in the premenopausal period: Secondary | ICD-10-CM | POA: Diagnosis not present

## 2020-04-26 DIAGNOSIS — N882 Stricture and stenosis of cervix uteri: Secondary | ICD-10-CM | POA: Diagnosis not present

## 2020-04-26 DIAGNOSIS — Z01419 Encounter for gynecological examination (general) (routine) without abnormal findings: Secondary | ICD-10-CM | POA: Diagnosis not present

## 2020-04-26 DIAGNOSIS — Z79810 Long term (current) use of selective estrogen receptor modulators (SERMs): Secondary | ICD-10-CM | POA: Diagnosis not present

## 2020-05-12 DIAGNOSIS — H04123 Dry eye syndrome of bilateral lacrimal glands: Secondary | ICD-10-CM | POA: Diagnosis not present

## 2020-05-12 DIAGNOSIS — H52203 Unspecified astigmatism, bilateral: Secondary | ICD-10-CM | POA: Diagnosis not present

## 2020-05-12 DIAGNOSIS — H5213 Myopia, bilateral: Secondary | ICD-10-CM | POA: Diagnosis not present

## 2020-05-13 DIAGNOSIS — M25511 Pain in right shoulder: Secondary | ICD-10-CM | POA: Diagnosis not present

## 2020-05-13 DIAGNOSIS — M25551 Pain in right hip: Secondary | ICD-10-CM | POA: Diagnosis not present

## 2020-05-13 DIAGNOSIS — M25512 Pain in left shoulder: Secondary | ICD-10-CM | POA: Diagnosis not present

## 2020-05-13 DIAGNOSIS — M25552 Pain in left hip: Secondary | ICD-10-CM | POA: Diagnosis not present

## 2020-06-16 DIAGNOSIS — Z13220 Encounter for screening for lipoid disorders: Secondary | ICD-10-CM | POA: Diagnosis not present

## 2020-06-16 DIAGNOSIS — Z131 Encounter for screening for diabetes mellitus: Secondary | ICD-10-CM | POA: Diagnosis not present

## 2020-06-16 DIAGNOSIS — Z0000 Encounter for general adult medical examination without abnormal findings: Secondary | ICD-10-CM | POA: Diagnosis not present

## 2020-06-20 DIAGNOSIS — M25511 Pain in right shoulder: Secondary | ICD-10-CM | POA: Diagnosis not present

## 2020-06-20 DIAGNOSIS — M25552 Pain in left hip: Secondary | ICD-10-CM | POA: Diagnosis not present

## 2020-06-20 DIAGNOSIS — M25512 Pain in left shoulder: Secondary | ICD-10-CM | POA: Diagnosis not present

## 2020-06-20 DIAGNOSIS — M25551 Pain in right hip: Secondary | ICD-10-CM | POA: Diagnosis not present

## 2020-07-31 DIAGNOSIS — Z20822 Contact with and (suspected) exposure to covid-19: Secondary | ICD-10-CM | POA: Diagnosis not present

## 2020-08-23 DIAGNOSIS — G8929 Other chronic pain: Secondary | ICD-10-CM | POA: Diagnosis not present

## 2020-08-23 DIAGNOSIS — M2242 Chondromalacia patellae, left knee: Secondary | ICD-10-CM | POA: Diagnosis not present

## 2020-08-23 DIAGNOSIS — M25561 Pain in right knee: Secondary | ICD-10-CM | POA: Diagnosis not present

## 2020-08-23 DIAGNOSIS — M25562 Pain in left knee: Secondary | ICD-10-CM | POA: Diagnosis not present

## 2020-08-23 DIAGNOSIS — M2240 Chondromalacia patellae, unspecified knee: Secondary | ICD-10-CM | POA: Diagnosis not present

## 2020-09-22 DIAGNOSIS — C50411 Malignant neoplasm of upper-outer quadrant of right female breast: Secondary | ICD-10-CM | POA: Diagnosis not present

## 2020-09-22 DIAGNOSIS — M8589 Other specified disorders of bone density and structure, multiple sites: Secondary | ICD-10-CM | POA: Diagnosis not present

## 2020-09-22 DIAGNOSIS — Z1379 Encounter for other screening for genetic and chromosomal anomalies: Secondary | ICD-10-CM | POA: Diagnosis not present

## 2020-09-22 DIAGNOSIS — Z79810 Long term (current) use of selective estrogen receptor modulators (SERMs): Secondary | ICD-10-CM | POA: Diagnosis not present

## 2020-09-22 DIAGNOSIS — D329 Benign neoplasm of meninges, unspecified: Secondary | ICD-10-CM | POA: Diagnosis not present

## 2020-09-22 DIAGNOSIS — Z170 Estrogen receptor positive status [ER+]: Secondary | ICD-10-CM | POA: Diagnosis not present

## 2020-09-22 DIAGNOSIS — I4891 Unspecified atrial fibrillation: Secondary | ICD-10-CM | POA: Diagnosis not present

## 2020-09-22 DIAGNOSIS — Z5181 Encounter for therapeutic drug level monitoring: Secondary | ICD-10-CM | POA: Diagnosis not present

## 2020-10-05 DIAGNOSIS — Z79810 Long term (current) use of selective estrogen receptor modulators (SERMs): Secondary | ICD-10-CM | POA: Diagnosis not present

## 2020-10-05 DIAGNOSIS — K640 First degree hemorrhoids: Secondary | ICD-10-CM | POA: Diagnosis not present

## 2020-10-05 DIAGNOSIS — Z1211 Encounter for screening for malignant neoplasm of colon: Secondary | ICD-10-CM | POA: Diagnosis not present

## 2020-10-05 DIAGNOSIS — Z79899 Other long term (current) drug therapy: Secondary | ICD-10-CM | POA: Diagnosis not present

## 2020-10-05 DIAGNOSIS — Z791 Long term (current) use of non-steroidal anti-inflammatories (NSAID): Secondary | ICD-10-CM | POA: Diagnosis not present

## 2020-10-05 DIAGNOSIS — C50911 Malignant neoplasm of unspecified site of right female breast: Secondary | ICD-10-CM | POA: Diagnosis not present

## 2020-10-22 DIAGNOSIS — S83232A Complex tear of medial meniscus, current injury, left knee, initial encounter: Secondary | ICD-10-CM | POA: Diagnosis not present

## 2020-10-22 DIAGNOSIS — M25562 Pain in left knee: Secondary | ICD-10-CM | POA: Diagnosis not present

## 2020-10-22 DIAGNOSIS — M2240 Chondromalacia patellae, unspecified knee: Secondary | ICD-10-CM | POA: Diagnosis not present

## 2020-10-22 DIAGNOSIS — M25561 Pain in right knee: Secondary | ICD-10-CM | POA: Diagnosis not present

## 2020-10-22 DIAGNOSIS — S83231A Complex tear of medial meniscus, current injury, right knee, initial encounter: Secondary | ICD-10-CM | POA: Diagnosis not present

## 2020-10-22 DIAGNOSIS — G8929 Other chronic pain: Secondary | ICD-10-CM | POA: Diagnosis not present

## 2020-10-31 DIAGNOSIS — M25561 Pain in right knee: Secondary | ICD-10-CM | POA: Diagnosis not present

## 2020-10-31 DIAGNOSIS — M2240 Chondromalacia patellae, unspecified knee: Secondary | ICD-10-CM | POA: Diagnosis not present

## 2020-10-31 DIAGNOSIS — G8929 Other chronic pain: Secondary | ICD-10-CM | POA: Diagnosis not present

## 2020-10-31 DIAGNOSIS — M25562 Pain in left knee: Secondary | ICD-10-CM | POA: Diagnosis not present

## 2020-11-28 DIAGNOSIS — G8929 Other chronic pain: Secondary | ICD-10-CM | POA: Diagnosis not present

## 2020-11-28 DIAGNOSIS — M25551 Pain in right hip: Secondary | ICD-10-CM | POA: Diagnosis not present

## 2020-11-28 DIAGNOSIS — M25562 Pain in left knee: Secondary | ICD-10-CM | POA: Diagnosis not present

## 2020-12-12 DIAGNOSIS — M25551 Pain in right hip: Secondary | ICD-10-CM | POA: Diagnosis not present

## 2020-12-12 DIAGNOSIS — M6281 Muscle weakness (generalized): Secondary | ICD-10-CM | POA: Diagnosis not present

## 2020-12-12 DIAGNOSIS — M25512 Pain in left shoulder: Secondary | ICD-10-CM | POA: Diagnosis not present

## 2020-12-12 DIAGNOSIS — G8929 Other chronic pain: Secondary | ICD-10-CM | POA: Diagnosis not present

## 2020-12-12 DIAGNOSIS — M25562 Pain in left knee: Secondary | ICD-10-CM | POA: Diagnosis not present

## 2021-01-04 DIAGNOSIS — M25562 Pain in left knee: Secondary | ICD-10-CM | POA: Diagnosis not present

## 2021-01-04 DIAGNOSIS — M25551 Pain in right hip: Secondary | ICD-10-CM | POA: Diagnosis not present

## 2021-01-04 DIAGNOSIS — G8929 Other chronic pain: Secondary | ICD-10-CM | POA: Diagnosis not present

## 2021-01-04 DIAGNOSIS — M6281 Muscle weakness (generalized): Secondary | ICD-10-CM | POA: Diagnosis not present

## 2021-01-04 DIAGNOSIS — M25512 Pain in left shoulder: Secondary | ICD-10-CM | POA: Diagnosis not present

## 2021-03-18 DIAGNOSIS — D329 Benign neoplasm of meninges, unspecified: Secondary | ICD-10-CM | POA: Diagnosis not present

## 2021-03-21 DIAGNOSIS — D429 Neoplasm of uncertain behavior of meninges, unspecified: Secondary | ICD-10-CM | POA: Diagnosis not present

## 2021-05-18 DIAGNOSIS — H01001 Unspecified blepharitis right upper eyelid: Secondary | ICD-10-CM | POA: Diagnosis not present

## 2021-05-18 DIAGNOSIS — H01004 Unspecified blepharitis left upper eyelid: Secondary | ICD-10-CM | POA: Diagnosis not present

## 2021-05-18 DIAGNOSIS — H5213 Myopia, bilateral: Secondary | ICD-10-CM | POA: Diagnosis not present

## 2021-06-30 DIAGNOSIS — Z853 Personal history of malignant neoplasm of breast: Secondary | ICD-10-CM | POA: Diagnosis not present

## 2021-06-30 DIAGNOSIS — Z13220 Encounter for screening for lipoid disorders: Secondary | ICD-10-CM | POA: Diagnosis not present

## 2021-06-30 DIAGNOSIS — Z0000 Encounter for general adult medical examination without abnormal findings: Secondary | ICD-10-CM | POA: Diagnosis not present

## 2021-07-30 DIAGNOSIS — Z20822 Contact with and (suspected) exposure to covid-19: Secondary | ICD-10-CM | POA: Diagnosis not present

## 2021-07-30 DIAGNOSIS — R059 Cough, unspecified: Secondary | ICD-10-CM | POA: Diagnosis not present

## 2021-07-30 DIAGNOSIS — U071 COVID-19: Secondary | ICD-10-CM | POA: Diagnosis not present

## 2021-08-12 DIAGNOSIS — J069 Acute upper respiratory infection, unspecified: Secondary | ICD-10-CM | POA: Diagnosis not present

## 2021-08-12 DIAGNOSIS — R059 Cough, unspecified: Secondary | ICD-10-CM | POA: Diagnosis not present

## 2021-08-12 DIAGNOSIS — H66002 Acute suppurative otitis media without spontaneous rupture of ear drum, left ear: Secondary | ICD-10-CM | POA: Diagnosis not present

## 2021-09-26 DIAGNOSIS — G43009 Migraine without aura, not intractable, without status migrainosus: Secondary | ICD-10-CM | POA: Diagnosis not present

## 2021-09-26 DIAGNOSIS — I4891 Unspecified atrial fibrillation: Secondary | ICD-10-CM | POA: Diagnosis not present

## 2021-09-26 DIAGNOSIS — D329 Benign neoplasm of meninges, unspecified: Secondary | ICD-10-CM | POA: Diagnosis not present

## 2021-10-17 DIAGNOSIS — H4911 Fourth [trochlear] nerve palsy, right eye: Secondary | ICD-10-CM | POA: Diagnosis not present

## 2021-10-17 DIAGNOSIS — H5310 Unspecified subjective visual disturbances: Secondary | ICD-10-CM | POA: Diagnosis not present

## 2021-10-17 DIAGNOSIS — Z9889 Other specified postprocedural states: Secondary | ICD-10-CM | POA: Diagnosis not present

## 2021-12-07 DIAGNOSIS — Z853 Personal history of malignant neoplasm of breast: Secondary | ICD-10-CM | POA: Diagnosis not present

## 2021-12-07 DIAGNOSIS — Z170 Estrogen receptor positive status [ER+]: Secondary | ICD-10-CM | POA: Diagnosis not present

## 2021-12-07 DIAGNOSIS — Z08 Encounter for follow-up examination after completed treatment for malignant neoplasm: Secondary | ICD-10-CM | POA: Diagnosis not present

## 2021-12-07 DIAGNOSIS — Z79811 Long term (current) use of aromatase inhibitors: Secondary | ICD-10-CM | POA: Diagnosis not present

## 2021-12-07 DIAGNOSIS — M8589 Other specified disorders of bone density and structure, multiple sites: Secondary | ICD-10-CM | POA: Diagnosis not present

## 2021-12-07 DIAGNOSIS — C50411 Malignant neoplasm of upper-outer quadrant of right female breast: Secondary | ICD-10-CM | POA: Diagnosis not present

## 2022-05-24 DIAGNOSIS — H532 Diplopia: Secondary | ICD-10-CM | POA: Diagnosis not present

## 2022-05-24 DIAGNOSIS — H52203 Unspecified astigmatism, bilateral: Secondary | ICD-10-CM | POA: Diagnosis not present

## 2022-05-24 DIAGNOSIS — H5213 Myopia, bilateral: Secondary | ICD-10-CM | POA: Diagnosis not present

## 2022-07-13 DIAGNOSIS — Z853 Personal history of malignant neoplasm of breast: Secondary | ICD-10-CM | POA: Diagnosis not present

## 2022-07-13 DIAGNOSIS — E7800 Pure hypercholesterolemia, unspecified: Secondary | ICD-10-CM | POA: Diagnosis not present

## 2022-07-13 DIAGNOSIS — Z0000 Encounter for general adult medical examination without abnormal findings: Secondary | ICD-10-CM | POA: Diagnosis not present

## 2022-07-13 DIAGNOSIS — Z131 Encounter for screening for diabetes mellitus: Secondary | ICD-10-CM | POA: Diagnosis not present

## 2022-09-26 DIAGNOSIS — G519 Disorder of facial nerve, unspecified: Secondary | ICD-10-CM | POA: Diagnosis not present

## 2022-09-26 DIAGNOSIS — D329 Benign neoplasm of meninges, unspecified: Secondary | ICD-10-CM | POA: Diagnosis not present

## 2022-09-26 DIAGNOSIS — R202 Paresthesia of skin: Secondary | ICD-10-CM | POA: Diagnosis not present

## 2022-09-26 DIAGNOSIS — R519 Headache, unspecified: Secondary | ICD-10-CM | POA: Diagnosis not present

## 2022-09-26 DIAGNOSIS — G43009 Migraine without aura, not intractable, without status migrainosus: Secondary | ICD-10-CM | POA: Diagnosis not present

## 2022-10-24 DIAGNOSIS — R519 Headache, unspecified: Secondary | ICD-10-CM | POA: Diagnosis not present

## 2022-10-24 DIAGNOSIS — M25519 Pain in unspecified shoulder: Secondary | ICD-10-CM | POA: Diagnosis not present

## 2022-10-24 DIAGNOSIS — M542 Cervicalgia: Secondary | ICD-10-CM | POA: Diagnosis not present

## 2022-11-15 DIAGNOSIS — R519 Headache, unspecified: Secondary | ICD-10-CM | POA: Diagnosis not present

## 2022-11-15 DIAGNOSIS — M542 Cervicalgia: Secondary | ICD-10-CM | POA: Diagnosis not present

## 2022-11-15 DIAGNOSIS — M25519 Pain in unspecified shoulder: Secondary | ICD-10-CM | POA: Diagnosis not present

## 2022-11-25 DIAGNOSIS — M542 Cervicalgia: Secondary | ICD-10-CM | POA: Diagnosis not present

## 2022-11-25 DIAGNOSIS — M25519 Pain in unspecified shoulder: Secondary | ICD-10-CM | POA: Diagnosis not present

## 2022-11-25 DIAGNOSIS — R519 Headache, unspecified: Secondary | ICD-10-CM | POA: Diagnosis not present

## 2022-12-11 DIAGNOSIS — Z9013 Acquired absence of bilateral breasts and nipples: Secondary | ICD-10-CM | POA: Diagnosis not present

## 2022-12-11 DIAGNOSIS — Z853 Personal history of malignant neoplasm of breast: Secondary | ICD-10-CM | POA: Diagnosis not present

## 2022-12-11 DIAGNOSIS — Z9229 Personal history of other drug therapy: Secondary | ICD-10-CM | POA: Diagnosis not present

## 2022-12-11 DIAGNOSIS — N938 Other specified abnormal uterine and vaginal bleeding: Secondary | ICD-10-CM | POA: Diagnosis not present

## 2022-12-20 DIAGNOSIS — N83292 Other ovarian cyst, left side: Secondary | ICD-10-CM | POA: Diagnosis not present

## 2022-12-20 DIAGNOSIS — Z853 Personal history of malignant neoplasm of breast: Secondary | ICD-10-CM | POA: Diagnosis not present

## 2022-12-20 DIAGNOSIS — N854 Malposition of uterus: Secondary | ICD-10-CM | POA: Diagnosis not present

## 2022-12-20 DIAGNOSIS — N939 Abnormal uterine and vaginal bleeding, unspecified: Secondary | ICD-10-CM | POA: Diagnosis not present

## 2022-12-20 DIAGNOSIS — Z9229 Personal history of other drug therapy: Secondary | ICD-10-CM | POA: Diagnosis not present

## 2022-12-20 DIAGNOSIS — N938 Other specified abnormal uterine and vaginal bleeding: Secondary | ICD-10-CM | POA: Diagnosis not present

## 2022-12-20 DIAGNOSIS — N83291 Other ovarian cyst, right side: Secondary | ICD-10-CM | POA: Diagnosis not present

## 2022-12-20 DIAGNOSIS — Z9013 Acquired absence of bilateral breasts and nipples: Secondary | ICD-10-CM | POA: Diagnosis not present

## 2022-12-31 DIAGNOSIS — Z853 Personal history of malignant neoplasm of breast: Secondary | ICD-10-CM | POA: Diagnosis not present

## 2022-12-31 DIAGNOSIS — Z124 Encounter for screening for malignant neoplasm of cervix: Secondary | ICD-10-CM | POA: Diagnosis not present

## 2022-12-31 DIAGNOSIS — N939 Abnormal uterine and vaginal bleeding, unspecified: Secondary | ICD-10-CM | POA: Diagnosis not present

## 2022-12-31 DIAGNOSIS — Z9013 Acquired absence of bilateral breasts and nipples: Secondary | ICD-10-CM | POA: Diagnosis not present

## 2022-12-31 DIAGNOSIS — R9389 Abnormal findings on diagnostic imaging of other specified body structures: Secondary | ICD-10-CM | POA: Diagnosis not present

## 2022-12-31 DIAGNOSIS — Z170 Estrogen receptor positive status [ER+]: Secondary | ICD-10-CM | POA: Diagnosis not present

## 2022-12-31 DIAGNOSIS — R87610 Atypical squamous cells of undetermined significance on cytologic smear of cervix (ASC-US): Secondary | ICD-10-CM | POA: Diagnosis not present

## 2022-12-31 DIAGNOSIS — Z1151 Encounter for screening for human papillomavirus (HPV): Secondary | ICD-10-CM | POA: Diagnosis not present

## 2022-12-31 DIAGNOSIS — Z9229 Personal history of other drug therapy: Secondary | ICD-10-CM | POA: Diagnosis not present

## 2023-01-08 DIAGNOSIS — N939 Abnormal uterine and vaginal bleeding, unspecified: Secondary | ICD-10-CM | POA: Diagnosis not present

## 2023-02-13 DIAGNOSIS — M419 Scoliosis, unspecified: Secondary | ICD-10-CM | POA: Diagnosis not present

## 2023-02-13 DIAGNOSIS — M549 Dorsalgia, unspecified: Secondary | ICD-10-CM | POA: Diagnosis not present

## 2023-02-13 DIAGNOSIS — M4802 Spinal stenosis, cervical region: Secondary | ICD-10-CM | POA: Diagnosis not present

## 2023-02-13 DIAGNOSIS — M4150 Other secondary scoliosis, site unspecified: Secondary | ICD-10-CM | POA: Diagnosis not present

## 2023-02-15 DIAGNOSIS — N939 Abnormal uterine and vaginal bleeding, unspecified: Secondary | ICD-10-CM | POA: Diagnosis not present

## 2023-02-25 DIAGNOSIS — N939 Abnormal uterine and vaginal bleeding, unspecified: Secondary | ICD-10-CM | POA: Diagnosis not present

## 2023-02-25 DIAGNOSIS — Z79810 Long term (current) use of selective estrogen receptor modulators (SERMs): Secondary | ICD-10-CM | POA: Diagnosis not present

## 2023-03-08 DIAGNOSIS — N939 Abnormal uterine and vaginal bleeding, unspecified: Secondary | ICD-10-CM | POA: Diagnosis not present

## 2023-03-23 DIAGNOSIS — D320 Benign neoplasm of cerebral meninges: Secondary | ICD-10-CM | POA: Diagnosis not present

## 2023-03-23 DIAGNOSIS — D429 Neoplasm of uncertain behavior of meninges, unspecified: Secondary | ICD-10-CM | POA: Diagnosis not present

## 2023-03-26 DIAGNOSIS — D320 Benign neoplasm of cerebral meninges: Secondary | ICD-10-CM | POA: Diagnosis not present

## 2023-03-26 DIAGNOSIS — D429 Neoplasm of uncertain behavior of meninges, unspecified: Secondary | ICD-10-CM | POA: Diagnosis not present

## 2023-03-28 DIAGNOSIS — M25519 Pain in unspecified shoulder: Secondary | ICD-10-CM | POA: Diagnosis not present

## 2023-03-28 DIAGNOSIS — R519 Headache, unspecified: Secondary | ICD-10-CM | POA: Diagnosis not present

## 2023-03-28 DIAGNOSIS — M542 Cervicalgia: Secondary | ICD-10-CM | POA: Diagnosis not present

## 2023-03-30 DIAGNOSIS — M545 Low back pain, unspecified: Secondary | ICD-10-CM | POA: Diagnosis not present

## 2023-03-30 DIAGNOSIS — M4150 Other secondary scoliosis, site unspecified: Secondary | ICD-10-CM | POA: Diagnosis not present

## 2023-03-30 DIAGNOSIS — M549 Dorsalgia, unspecified: Secondary | ICD-10-CM | POA: Diagnosis not present

## 2023-03-30 DIAGNOSIS — M47812 Spondylosis without myelopathy or radiculopathy, cervical region: Secondary | ICD-10-CM | POA: Diagnosis not present

## 2023-03-30 DIAGNOSIS — M546 Pain in thoracic spine: Secondary | ICD-10-CM | POA: Diagnosis not present

## 2023-03-30 DIAGNOSIS — G8929 Other chronic pain: Secondary | ICD-10-CM | POA: Diagnosis not present

## 2023-03-30 DIAGNOSIS — M47816 Spondylosis without myelopathy or radiculopathy, lumbar region: Secondary | ICD-10-CM | POA: Diagnosis not present

## 2023-03-30 DIAGNOSIS — M4316 Spondylolisthesis, lumbar region: Secondary | ICD-10-CM | POA: Diagnosis not present

## 2023-03-30 DIAGNOSIS — M4802 Spinal stenosis, cervical region: Secondary | ICD-10-CM | POA: Diagnosis not present

## 2023-04-02 DIAGNOSIS — M25519 Pain in unspecified shoulder: Secondary | ICD-10-CM | POA: Diagnosis not present

## 2023-04-02 DIAGNOSIS — R519 Headache, unspecified: Secondary | ICD-10-CM | POA: Diagnosis not present

## 2023-04-02 DIAGNOSIS — M542 Cervicalgia: Secondary | ICD-10-CM | POA: Diagnosis not present

## 2023-04-03 DIAGNOSIS — R519 Headache, unspecified: Secondary | ICD-10-CM | POA: Diagnosis not present

## 2023-04-03 DIAGNOSIS — M25519 Pain in unspecified shoulder: Secondary | ICD-10-CM | POA: Diagnosis not present

## 2023-04-03 DIAGNOSIS — M542 Cervicalgia: Secondary | ICD-10-CM | POA: Diagnosis not present

## 2023-04-04 DIAGNOSIS — L989 Disorder of the skin and subcutaneous tissue, unspecified: Secondary | ICD-10-CM | POA: Diagnosis not present

## 2023-04-04 DIAGNOSIS — K219 Gastro-esophageal reflux disease without esophagitis: Secondary | ICD-10-CM | POA: Diagnosis not present

## 2023-04-04 DIAGNOSIS — Z23 Encounter for immunization: Secondary | ICD-10-CM | POA: Diagnosis not present

## 2023-04-04 DIAGNOSIS — R1310 Dysphagia, unspecified: Secondary | ICD-10-CM | POA: Diagnosis not present

## 2023-04-04 DIAGNOSIS — E782 Mixed hyperlipidemia: Secondary | ICD-10-CM | POA: Diagnosis not present

## 2023-04-05 DIAGNOSIS — M542 Cervicalgia: Secondary | ICD-10-CM | POA: Diagnosis not present

## 2023-04-05 DIAGNOSIS — M25519 Pain in unspecified shoulder: Secondary | ICD-10-CM | POA: Diagnosis not present

## 2023-04-05 DIAGNOSIS — R519 Headache, unspecified: Secondary | ICD-10-CM | POA: Diagnosis not present

## 2023-04-08 DIAGNOSIS — M25519 Pain in unspecified shoulder: Secondary | ICD-10-CM | POA: Diagnosis not present

## 2023-04-08 DIAGNOSIS — R519 Headache, unspecified: Secondary | ICD-10-CM | POA: Diagnosis not present

## 2023-04-08 DIAGNOSIS — M542 Cervicalgia: Secondary | ICD-10-CM | POA: Diagnosis not present

## 2023-04-09 DIAGNOSIS — M25519 Pain in unspecified shoulder: Secondary | ICD-10-CM | POA: Diagnosis not present

## 2023-04-09 DIAGNOSIS — M542 Cervicalgia: Secondary | ICD-10-CM | POA: Diagnosis not present

## 2023-04-09 DIAGNOSIS — R519 Headache, unspecified: Secondary | ICD-10-CM | POA: Diagnosis not present

## 2023-04-10 DIAGNOSIS — G8929 Other chronic pain: Secondary | ICD-10-CM | POA: Diagnosis not present

## 2023-04-10 DIAGNOSIS — M222X2 Patellofemoral disorders, left knee: Secondary | ICD-10-CM | POA: Diagnosis not present

## 2023-04-10 DIAGNOSIS — M222X1 Patellofemoral disorders, right knee: Secondary | ICD-10-CM | POA: Diagnosis not present

## 2023-04-10 DIAGNOSIS — M549 Dorsalgia, unspecified: Secondary | ICD-10-CM | POA: Diagnosis not present

## 2023-04-10 DIAGNOSIS — M25561 Pain in right knee: Secondary | ICD-10-CM | POA: Diagnosis not present

## 2023-04-10 DIAGNOSIS — M170 Bilateral primary osteoarthritis of knee: Secondary | ICD-10-CM | POA: Diagnosis not present

## 2023-04-10 DIAGNOSIS — M25462 Effusion, left knee: Secondary | ICD-10-CM | POA: Diagnosis not present

## 2023-04-10 DIAGNOSIS — M25461 Effusion, right knee: Secondary | ICD-10-CM | POA: Diagnosis not present

## 2023-04-10 DIAGNOSIS — M25562 Pain in left knee: Secondary | ICD-10-CM | POA: Diagnosis not present

## 2023-04-11 DIAGNOSIS — R519 Headache, unspecified: Secondary | ICD-10-CM | POA: Diagnosis not present

## 2023-04-11 DIAGNOSIS — M25519 Pain in unspecified shoulder: Secondary | ICD-10-CM | POA: Diagnosis not present

## 2023-04-11 DIAGNOSIS — M542 Cervicalgia: Secondary | ICD-10-CM | POA: Diagnosis not present

## 2023-04-12 DIAGNOSIS — F40240 Claustrophobia: Secondary | ICD-10-CM | POA: Diagnosis not present

## 2023-04-12 DIAGNOSIS — D320 Benign neoplasm of cerebral meninges: Secondary | ICD-10-CM | POA: Diagnosis not present

## 2023-04-12 DIAGNOSIS — D429 Neoplasm of uncertain behavior of meninges, unspecified: Secondary | ICD-10-CM | POA: Diagnosis not present

## 2023-04-15 DIAGNOSIS — R519 Headache, unspecified: Secondary | ICD-10-CM | POA: Diagnosis not present

## 2023-04-15 DIAGNOSIS — M25519 Pain in unspecified shoulder: Secondary | ICD-10-CM | POA: Diagnosis not present

## 2023-04-15 DIAGNOSIS — M542 Cervicalgia: Secondary | ICD-10-CM | POA: Diagnosis not present

## 2023-04-17 DIAGNOSIS — M549 Dorsalgia, unspecified: Secondary | ICD-10-CM | POA: Diagnosis not present

## 2023-04-17 DIAGNOSIS — M25569 Pain in unspecified knee: Secondary | ICD-10-CM | POA: Diagnosis not present

## 2023-04-17 DIAGNOSIS — M25519 Pain in unspecified shoulder: Secondary | ICD-10-CM | POA: Diagnosis not present

## 2023-04-17 DIAGNOSIS — M542 Cervicalgia: Secondary | ICD-10-CM | POA: Diagnosis not present

## 2023-04-17 DIAGNOSIS — M25559 Pain in unspecified hip: Secondary | ICD-10-CM | POA: Diagnosis not present

## 2023-04-19 DIAGNOSIS — M25569 Pain in unspecified knee: Secondary | ICD-10-CM | POA: Diagnosis not present

## 2023-04-19 DIAGNOSIS — M25559 Pain in unspecified hip: Secondary | ICD-10-CM | POA: Diagnosis not present

## 2023-04-19 DIAGNOSIS — M25519 Pain in unspecified shoulder: Secondary | ICD-10-CM | POA: Diagnosis not present

## 2023-04-19 DIAGNOSIS — M542 Cervicalgia: Secondary | ICD-10-CM | POA: Diagnosis not present

## 2023-04-22 DIAGNOSIS — M549 Dorsalgia, unspecified: Secondary | ICD-10-CM | POA: Diagnosis not present

## 2023-04-22 DIAGNOSIS — D320 Benign neoplasm of cerebral meninges: Secondary | ICD-10-CM | POA: Diagnosis not present

## 2023-04-22 DIAGNOSIS — C50811 Malignant neoplasm of overlapping sites of right female breast: Secondary | ICD-10-CM | POA: Diagnosis not present

## 2023-04-23 DIAGNOSIS — M542 Cervicalgia: Secondary | ICD-10-CM | POA: Diagnosis not present

## 2023-04-23 DIAGNOSIS — M25569 Pain in unspecified knee: Secondary | ICD-10-CM | POA: Diagnosis not present

## 2023-04-23 DIAGNOSIS — M25519 Pain in unspecified shoulder: Secondary | ICD-10-CM | POA: Diagnosis not present

## 2023-04-23 DIAGNOSIS — F40240 Claustrophobia: Secondary | ICD-10-CM | POA: Diagnosis not present

## 2023-04-23 DIAGNOSIS — M25559 Pain in unspecified hip: Secondary | ICD-10-CM | POA: Diagnosis not present

## 2023-04-23 DIAGNOSIS — D320 Benign neoplasm of cerebral meninges: Secondary | ICD-10-CM | POA: Diagnosis not present

## 2023-04-23 DIAGNOSIS — D429 Neoplasm of uncertain behavior of meninges, unspecified: Secondary | ICD-10-CM | POA: Diagnosis not present

## 2023-04-24 DIAGNOSIS — M542 Cervicalgia: Secondary | ICD-10-CM | POA: Diagnosis not present

## 2023-04-24 DIAGNOSIS — D320 Benign neoplasm of cerebral meninges: Secondary | ICD-10-CM | POA: Diagnosis not present

## 2023-04-24 DIAGNOSIS — M25569 Pain in unspecified knee: Secondary | ICD-10-CM | POA: Diagnosis not present

## 2023-04-24 DIAGNOSIS — Z79899 Other long term (current) drug therapy: Secondary | ICD-10-CM | POA: Diagnosis not present

## 2023-04-24 DIAGNOSIS — M549 Dorsalgia, unspecified: Secondary | ICD-10-CM | POA: Diagnosis not present

## 2023-04-24 DIAGNOSIS — M25519 Pain in unspecified shoulder: Secondary | ICD-10-CM | POA: Diagnosis not present

## 2023-04-24 DIAGNOSIS — M25559 Pain in unspecified hip: Secondary | ICD-10-CM | POA: Diagnosis not present

## 2023-04-25 DIAGNOSIS — M25569 Pain in unspecified knee: Secondary | ICD-10-CM | POA: Diagnosis not present

## 2023-04-25 DIAGNOSIS — M542 Cervicalgia: Secondary | ICD-10-CM | POA: Diagnosis not present

## 2023-04-25 DIAGNOSIS — M25519 Pain in unspecified shoulder: Secondary | ICD-10-CM | POA: Diagnosis not present

## 2023-04-25 DIAGNOSIS — M25559 Pain in unspecified hip: Secondary | ICD-10-CM | POA: Diagnosis not present

## 2023-04-25 DIAGNOSIS — D320 Benign neoplasm of cerebral meninges: Secondary | ICD-10-CM | POA: Diagnosis not present

## 2023-04-26 DIAGNOSIS — D320 Benign neoplasm of cerebral meninges: Secondary | ICD-10-CM | POA: Diagnosis not present

## 2023-04-29 DIAGNOSIS — D320 Benign neoplasm of cerebral meninges: Secondary | ICD-10-CM | POA: Diagnosis not present

## 2023-04-30 DIAGNOSIS — D320 Benign neoplasm of cerebral meninges: Secondary | ICD-10-CM | POA: Diagnosis not present

## 2023-04-30 DIAGNOSIS — M549 Dorsalgia, unspecified: Secondary | ICD-10-CM | POA: Diagnosis not present

## 2023-05-01 DIAGNOSIS — D320 Benign neoplasm of cerebral meninges: Secondary | ICD-10-CM | POA: Diagnosis not present

## 2023-05-02 DIAGNOSIS — M25569 Pain in unspecified knee: Secondary | ICD-10-CM | POA: Diagnosis not present

## 2023-05-02 DIAGNOSIS — M549 Dorsalgia, unspecified: Secondary | ICD-10-CM | POA: Diagnosis not present

## 2023-05-02 DIAGNOSIS — D320 Benign neoplasm of cerebral meninges: Secondary | ICD-10-CM | POA: Diagnosis not present

## 2023-05-02 DIAGNOSIS — M25559 Pain in unspecified hip: Secondary | ICD-10-CM | POA: Diagnosis not present

## 2023-05-02 DIAGNOSIS — M25519 Pain in unspecified shoulder: Secondary | ICD-10-CM | POA: Diagnosis not present

## 2023-05-02 DIAGNOSIS — M542 Cervicalgia: Secondary | ICD-10-CM | POA: Diagnosis not present

## 2023-05-03 DIAGNOSIS — D320 Benign neoplasm of cerebral meninges: Secondary | ICD-10-CM | POA: Diagnosis not present

## 2023-05-06 DIAGNOSIS — D320 Benign neoplasm of cerebral meninges: Secondary | ICD-10-CM | POA: Diagnosis not present

## 2023-05-07 DIAGNOSIS — C50911 Malignant neoplasm of unspecified site of right female breast: Secondary | ICD-10-CM | POA: Diagnosis not present

## 2023-05-07 DIAGNOSIS — D329 Benign neoplasm of meninges, unspecified: Secondary | ICD-10-CM | POA: Diagnosis not present

## 2023-05-07 DIAGNOSIS — R09A2 Foreign body sensation, throat: Secondary | ICD-10-CM | POA: Diagnosis not present

## 2023-05-07 DIAGNOSIS — Z853 Personal history of malignant neoplasm of breast: Secondary | ICD-10-CM | POA: Diagnosis not present

## 2023-05-07 DIAGNOSIS — R1310 Dysphagia, unspecified: Secondary | ICD-10-CM | POA: Diagnosis not present

## 2023-05-07 DIAGNOSIS — M549 Dorsalgia, unspecified: Secondary | ICD-10-CM | POA: Diagnosis not present

## 2023-05-07 DIAGNOSIS — D320 Benign neoplasm of cerebral meninges: Secondary | ICD-10-CM | POA: Diagnosis not present

## 2023-05-08 DIAGNOSIS — D320 Benign neoplasm of cerebral meninges: Secondary | ICD-10-CM | POA: Diagnosis not present

## 2023-05-08 DIAGNOSIS — M542 Cervicalgia: Secondary | ICD-10-CM | POA: Diagnosis not present

## 2023-05-08 DIAGNOSIS — M25559 Pain in unspecified hip: Secondary | ICD-10-CM | POA: Diagnosis not present

## 2023-05-08 DIAGNOSIS — M25519 Pain in unspecified shoulder: Secondary | ICD-10-CM | POA: Diagnosis not present

## 2023-05-08 DIAGNOSIS — M25569 Pain in unspecified knee: Secondary | ICD-10-CM | POA: Diagnosis not present

## 2023-05-09 DIAGNOSIS — M549 Dorsalgia, unspecified: Secondary | ICD-10-CM | POA: Diagnosis not present

## 2023-05-09 DIAGNOSIS — D320 Benign neoplasm of cerebral meninges: Secondary | ICD-10-CM | POA: Diagnosis not present

## 2023-05-10 DIAGNOSIS — D320 Benign neoplasm of cerebral meninges: Secondary | ICD-10-CM | POA: Diagnosis not present

## 2023-05-13 DIAGNOSIS — D320 Benign neoplasm of cerebral meninges: Secondary | ICD-10-CM | POA: Diagnosis not present

## 2023-05-13 DIAGNOSIS — M549 Dorsalgia, unspecified: Secondary | ICD-10-CM | POA: Diagnosis not present

## 2023-05-14 DIAGNOSIS — D320 Benign neoplasm of cerebral meninges: Secondary | ICD-10-CM | POA: Diagnosis not present

## 2023-05-14 DIAGNOSIS — M25519 Pain in unspecified shoulder: Secondary | ICD-10-CM | POA: Diagnosis not present

## 2023-05-14 DIAGNOSIS — M25559 Pain in unspecified hip: Secondary | ICD-10-CM | POA: Diagnosis not present

## 2023-05-14 DIAGNOSIS — M542 Cervicalgia: Secondary | ICD-10-CM | POA: Diagnosis not present

## 2023-05-14 DIAGNOSIS — M25569 Pain in unspecified knee: Secondary | ICD-10-CM | POA: Diagnosis not present

## 2023-05-15 DIAGNOSIS — M549 Dorsalgia, unspecified: Secondary | ICD-10-CM | POA: Diagnosis not present

## 2023-05-15 DIAGNOSIS — D320 Benign neoplasm of cerebral meninges: Secondary | ICD-10-CM | POA: Diagnosis not present

## 2023-05-16 DIAGNOSIS — Z1379 Encounter for other screening for genetic and chromosomal anomalies: Secondary | ICD-10-CM | POA: Diagnosis not present

## 2023-05-16 DIAGNOSIS — D429 Neoplasm of uncertain behavior of meninges, unspecified: Secondary | ICD-10-CM | POA: Diagnosis not present

## 2023-05-16 DIAGNOSIS — D320 Benign neoplasm of cerebral meninges: Secondary | ICD-10-CM | POA: Diagnosis not present

## 2023-05-16 DIAGNOSIS — G2581 Restless legs syndrome: Secondary | ICD-10-CM | POA: Diagnosis not present

## 2023-05-16 DIAGNOSIS — M25569 Pain in unspecified knee: Secondary | ICD-10-CM | POA: Diagnosis not present

## 2023-05-16 DIAGNOSIS — Z170 Estrogen receptor positive status [ER+]: Secondary | ICD-10-CM | POA: Diagnosis not present

## 2023-05-16 DIAGNOSIS — M542 Cervicalgia: Secondary | ICD-10-CM | POA: Diagnosis not present

## 2023-05-16 DIAGNOSIS — Z01818 Encounter for other preprocedural examination: Secondary | ICD-10-CM | POA: Diagnosis not present

## 2023-05-16 DIAGNOSIS — M249 Joint derangement, unspecified: Secondary | ICD-10-CM | POA: Diagnosis not present

## 2023-05-16 DIAGNOSIS — R1310 Dysphagia, unspecified: Secondary | ICD-10-CM | POA: Diagnosis not present

## 2023-05-16 DIAGNOSIS — M25519 Pain in unspecified shoulder: Secondary | ICD-10-CM | POA: Diagnosis not present

## 2023-05-16 DIAGNOSIS — I480 Paroxysmal atrial fibrillation: Secondary | ICD-10-CM | POA: Diagnosis not present

## 2023-05-16 DIAGNOSIS — M6208 Separation of muscle (nontraumatic), other site: Secondary | ICD-10-CM | POA: Diagnosis not present

## 2023-05-16 DIAGNOSIS — D329 Benign neoplasm of meninges, unspecified: Secondary | ICD-10-CM | POA: Diagnosis not present

## 2023-05-16 DIAGNOSIS — M25559 Pain in unspecified hip: Secondary | ICD-10-CM | POA: Diagnosis not present

## 2023-05-16 DIAGNOSIS — C50411 Malignant neoplasm of upper-outer quadrant of right female breast: Secondary | ICD-10-CM | POA: Diagnosis not present

## 2023-05-17 DIAGNOSIS — D320 Benign neoplasm of cerebral meninges: Secondary | ICD-10-CM | POA: Diagnosis not present

## 2023-05-20 DIAGNOSIS — D320 Benign neoplasm of cerebral meninges: Secondary | ICD-10-CM | POA: Diagnosis not present

## 2023-05-21 DIAGNOSIS — D320 Benign neoplasm of cerebral meninges: Secondary | ICD-10-CM | POA: Diagnosis not present

## 2023-05-21 DIAGNOSIS — M549 Dorsalgia, unspecified: Secondary | ICD-10-CM | POA: Diagnosis not present

## 2023-05-22 DIAGNOSIS — D320 Benign neoplasm of cerebral meninges: Secondary | ICD-10-CM | POA: Diagnosis not present

## 2023-05-22 DIAGNOSIS — M25519 Pain in unspecified shoulder: Secondary | ICD-10-CM | POA: Diagnosis not present

## 2023-05-22 DIAGNOSIS — M542 Cervicalgia: Secondary | ICD-10-CM | POA: Diagnosis not present

## 2023-05-22 DIAGNOSIS — M25569 Pain in unspecified knee: Secondary | ICD-10-CM | POA: Diagnosis not present

## 2023-05-22 DIAGNOSIS — M25559 Pain in unspecified hip: Secondary | ICD-10-CM | POA: Diagnosis not present

## 2023-05-23 DIAGNOSIS — M542 Cervicalgia: Secondary | ICD-10-CM | POA: Diagnosis not present

## 2023-05-23 DIAGNOSIS — M549 Dorsalgia, unspecified: Secondary | ICD-10-CM | POA: Diagnosis not present

## 2023-05-23 DIAGNOSIS — M25559 Pain in unspecified hip: Secondary | ICD-10-CM | POA: Diagnosis not present

## 2023-05-23 DIAGNOSIS — M25569 Pain in unspecified knee: Secondary | ICD-10-CM | POA: Diagnosis not present

## 2023-05-23 DIAGNOSIS — D320 Benign neoplasm of cerebral meninges: Secondary | ICD-10-CM | POA: Diagnosis not present

## 2023-05-23 DIAGNOSIS — M25519 Pain in unspecified shoulder: Secondary | ICD-10-CM | POA: Diagnosis not present

## 2023-05-24 DIAGNOSIS — D320 Benign neoplasm of cerebral meninges: Secondary | ICD-10-CM | POA: Diagnosis not present

## 2023-05-27 DIAGNOSIS — D320 Benign neoplasm of cerebral meninges: Secondary | ICD-10-CM | POA: Diagnosis not present

## 2023-05-28 DIAGNOSIS — D320 Benign neoplasm of cerebral meninges: Secondary | ICD-10-CM | POA: Diagnosis not present

## 2023-05-28 DIAGNOSIS — M542 Cervicalgia: Secondary | ICD-10-CM | POA: Diagnosis not present

## 2023-05-28 DIAGNOSIS — M25519 Pain in unspecified shoulder: Secondary | ICD-10-CM | POA: Diagnosis not present

## 2023-05-28 DIAGNOSIS — M25569 Pain in unspecified knee: Secondary | ICD-10-CM | POA: Diagnosis not present

## 2023-05-28 DIAGNOSIS — M25559 Pain in unspecified hip: Secondary | ICD-10-CM | POA: Diagnosis not present

## 2023-05-29 DIAGNOSIS — D320 Benign neoplasm of cerebral meninges: Secondary | ICD-10-CM | POA: Diagnosis not present

## 2023-05-30 DIAGNOSIS — D320 Benign neoplasm of cerebral meninges: Secondary | ICD-10-CM | POA: Diagnosis not present

## 2023-05-30 DIAGNOSIS — M25519 Pain in unspecified shoulder: Secondary | ICD-10-CM | POA: Diagnosis not present

## 2023-05-30 DIAGNOSIS — M542 Cervicalgia: Secondary | ICD-10-CM | POA: Diagnosis not present

## 2023-05-30 DIAGNOSIS — M25559 Pain in unspecified hip: Secondary | ICD-10-CM | POA: Diagnosis not present

## 2023-05-30 DIAGNOSIS — M25569 Pain in unspecified knee: Secondary | ICD-10-CM | POA: Diagnosis not present

## 2023-05-31 DIAGNOSIS — D320 Benign neoplasm of cerebral meninges: Secondary | ICD-10-CM | POA: Diagnosis not present

## 2023-05-31 DIAGNOSIS — D329 Benign neoplasm of meninges, unspecified: Secondary | ICD-10-CM | POA: Diagnosis not present

## 2023-06-05 DIAGNOSIS — Z885 Allergy status to narcotic agent status: Secondary | ICD-10-CM | POA: Diagnosis not present

## 2023-06-05 DIAGNOSIS — K317 Polyp of stomach and duodenum: Secondary | ICD-10-CM | POA: Diagnosis not present

## 2023-06-05 DIAGNOSIS — K2289 Other specified disease of esophagus: Secondary | ICD-10-CM | POA: Diagnosis not present

## 2023-06-05 DIAGNOSIS — I4891 Unspecified atrial fibrillation: Secondary | ICD-10-CM | POA: Diagnosis not present

## 2023-06-05 DIAGNOSIS — K3189 Other diseases of stomach and duodenum: Secondary | ICD-10-CM | POA: Diagnosis not present

## 2023-06-05 DIAGNOSIS — R1310 Dysphagia, unspecified: Secondary | ICD-10-CM | POA: Diagnosis not present

## 2023-06-05 DIAGNOSIS — R0989 Other specified symptoms and signs involving the circulatory and respiratory systems: Secondary | ICD-10-CM | POA: Diagnosis not present

## 2023-06-06 DIAGNOSIS — M25559 Pain in unspecified hip: Secondary | ICD-10-CM | POA: Diagnosis not present

## 2023-06-06 DIAGNOSIS — M25519 Pain in unspecified shoulder: Secondary | ICD-10-CM | POA: Diagnosis not present

## 2023-06-06 DIAGNOSIS — M25569 Pain in unspecified knee: Secondary | ICD-10-CM | POA: Diagnosis not present

## 2023-06-06 DIAGNOSIS — M542 Cervicalgia: Secondary | ICD-10-CM | POA: Diagnosis not present

## 2023-06-07 DIAGNOSIS — M25519 Pain in unspecified shoulder: Secondary | ICD-10-CM | POA: Diagnosis not present

## 2023-06-07 DIAGNOSIS — M542 Cervicalgia: Secondary | ICD-10-CM | POA: Diagnosis not present

## 2023-06-07 DIAGNOSIS — M25569 Pain in unspecified knee: Secondary | ICD-10-CM | POA: Diagnosis not present

## 2023-06-07 DIAGNOSIS — M25559 Pain in unspecified hip: Secondary | ICD-10-CM | POA: Diagnosis not present

## 2023-06-10 DIAGNOSIS — M25559 Pain in unspecified hip: Secondary | ICD-10-CM | POA: Diagnosis not present

## 2023-06-10 DIAGNOSIS — M25519 Pain in unspecified shoulder: Secondary | ICD-10-CM | POA: Diagnosis not present

## 2023-06-10 DIAGNOSIS — M542 Cervicalgia: Secondary | ICD-10-CM | POA: Diagnosis not present

## 2023-06-10 DIAGNOSIS — M25569 Pain in unspecified knee: Secondary | ICD-10-CM | POA: Diagnosis not present

## 2023-06-11 DIAGNOSIS — N939 Abnormal uterine and vaginal bleeding, unspecified: Secondary | ICD-10-CM | POA: Diagnosis not present

## 2023-06-12 DIAGNOSIS — M542 Cervicalgia: Secondary | ICD-10-CM | POA: Diagnosis not present

## 2023-06-12 DIAGNOSIS — M25559 Pain in unspecified hip: Secondary | ICD-10-CM | POA: Diagnosis not present

## 2023-06-12 DIAGNOSIS — M25519 Pain in unspecified shoulder: Secondary | ICD-10-CM | POA: Diagnosis not present

## 2023-06-12 DIAGNOSIS — M25569 Pain in unspecified knee: Secondary | ICD-10-CM | POA: Diagnosis not present

## 2023-06-14 DIAGNOSIS — G43909 Migraine, unspecified, not intractable, without status migrainosus: Secondary | ICD-10-CM | POA: Diagnosis not present

## 2023-06-14 DIAGNOSIS — D329 Benign neoplasm of meninges, unspecified: Secondary | ICD-10-CM | POA: Diagnosis not present

## 2023-06-14 DIAGNOSIS — R0789 Other chest pain: Secondary | ICD-10-CM | POA: Diagnosis not present

## 2023-06-14 DIAGNOSIS — Z23 Encounter for immunization: Secondary | ICD-10-CM | POA: Diagnosis not present

## 2023-06-28 DIAGNOSIS — R490 Dysphonia: Secondary | ICD-10-CM | POA: Diagnosis not present

## 2023-06-28 DIAGNOSIS — J383 Other diseases of vocal cords: Secondary | ICD-10-CM | POA: Diagnosis not present

## 2023-06-28 DIAGNOSIS — R053 Chronic cough: Secondary | ICD-10-CM | POA: Diagnosis not present

## 2023-06-28 DIAGNOSIS — J384 Edema of larynx: Secondary | ICD-10-CM | POA: Diagnosis not present

## 2023-07-19 DIAGNOSIS — Z0000 Encounter for general adult medical examination without abnormal findings: Secondary | ICD-10-CM | POA: Diagnosis not present

## 2023-07-19 DIAGNOSIS — R110 Nausea: Secondary | ICD-10-CM | POA: Diagnosis not present

## 2023-07-19 DIAGNOSIS — R5381 Other malaise: Secondary | ICD-10-CM | POA: Diagnosis not present

## 2023-07-19 DIAGNOSIS — E782 Mixed hyperlipidemia: Secondary | ICD-10-CM | POA: Diagnosis not present

## 2023-07-19 DIAGNOSIS — Z131 Encounter for screening for diabetes mellitus: Secondary | ICD-10-CM | POA: Diagnosis not present

## 2023-07-24 DIAGNOSIS — R110 Nausea: Secondary | ICD-10-CM | POA: Diagnosis not present

## 2023-07-24 DIAGNOSIS — D329 Benign neoplasm of meninges, unspecified: Secondary | ICD-10-CM | POA: Diagnosis not present

## 2023-07-31 DIAGNOSIS — J383 Other diseases of vocal cords: Secondary | ICD-10-CM | POA: Diagnosis not present

## 2023-07-31 DIAGNOSIS — R490 Dysphonia: Secondary | ICD-10-CM | POA: Diagnosis not present

## 2023-07-31 DIAGNOSIS — R053 Chronic cough: Secondary | ICD-10-CM | POA: Diagnosis not present

## 2023-07-31 DIAGNOSIS — J384 Edema of larynx: Secondary | ICD-10-CM | POA: Diagnosis not present

## 2023-08-08 DIAGNOSIS — R490 Dysphonia: Secondary | ICD-10-CM | POA: Diagnosis not present

## 2023-08-08 DIAGNOSIS — J383 Other diseases of vocal cords: Secondary | ICD-10-CM | POA: Diagnosis not present

## 2023-08-08 DIAGNOSIS — J384 Edema of larynx: Secondary | ICD-10-CM | POA: Diagnosis not present

## 2023-08-08 DIAGNOSIS — R053 Chronic cough: Secondary | ICD-10-CM | POA: Diagnosis not present

## 2023-08-25 DIAGNOSIS — D329 Benign neoplasm of meninges, unspecified: Secondary | ICD-10-CM | POA: Diagnosis not present

## 2023-08-26 DIAGNOSIS — D2260 Melanocytic nevi of unspecified upper limb, including shoulder: Secondary | ICD-10-CM | POA: Diagnosis not present

## 2023-08-26 DIAGNOSIS — L814 Other melanin hyperpigmentation: Secondary | ICD-10-CM | POA: Diagnosis not present

## 2023-08-26 DIAGNOSIS — D485 Neoplasm of uncertain behavior of skin: Secondary | ICD-10-CM | POA: Diagnosis not present

## 2023-08-26 DIAGNOSIS — L821 Other seborrheic keratosis: Secondary | ICD-10-CM | POA: Diagnosis not present

## 2023-08-27 DIAGNOSIS — D429 Neoplasm of uncertain behavior of meninges, unspecified: Secondary | ICD-10-CM | POA: Diagnosis not present

## 2023-08-27 DIAGNOSIS — D320 Benign neoplasm of cerebral meninges: Secondary | ICD-10-CM | POA: Diagnosis not present

## 2023-08-27 DIAGNOSIS — C50811 Malignant neoplasm of overlapping sites of right female breast: Secondary | ICD-10-CM | POA: Diagnosis not present

## 2023-08-27 DIAGNOSIS — D329 Benign neoplasm of meninges, unspecified: Secondary | ICD-10-CM | POA: Diagnosis not present

## 2023-08-27 DIAGNOSIS — F40240 Claustrophobia: Secondary | ICD-10-CM | POA: Diagnosis not present

## 2023-09-27 DIAGNOSIS — R202 Paresthesia of skin: Secondary | ICD-10-CM | POA: Diagnosis not present

## 2023-09-27 DIAGNOSIS — G43719 Chronic migraine without aura, intractable, without status migrainosus: Secondary | ICD-10-CM | POA: Diagnosis not present

## 2023-09-27 DIAGNOSIS — G479 Sleep disorder, unspecified: Secondary | ICD-10-CM | POA: Diagnosis not present

## 2023-09-27 DIAGNOSIS — D329 Benign neoplasm of meninges, unspecified: Secondary | ICD-10-CM | POA: Diagnosis not present

## 2023-11-06 ENCOUNTER — Encounter: Payer: Self-pay | Admitting: Internal Medicine

## 2023-11-06 ENCOUNTER — Ambulatory Visit: Payer: Self-pay | Admitting: Internal Medicine

## 2023-11-06 ENCOUNTER — Other Ambulatory Visit: Payer: Self-pay

## 2023-11-06 VITALS — BP 100/68 | HR 68 | Temp 98.0°F | Resp 16 | Ht 69.0 in | Wt 144.6 lb

## 2023-11-06 DIAGNOSIS — R490 Dysphonia: Secondary | ICD-10-CM | POA: Diagnosis not present

## 2023-11-06 DIAGNOSIS — R09A2 Foreign body sensation, throat: Secondary | ICD-10-CM | POA: Diagnosis not present

## 2023-11-06 DIAGNOSIS — R0982 Postnasal drip: Secondary | ICD-10-CM

## 2023-11-06 DIAGNOSIS — J3089 Other allergic rhinitis: Secondary | ICD-10-CM | POA: Diagnosis not present

## 2023-11-06 NOTE — Progress Notes (Signed)
 NEW PATIENT  Date of Service/Encounter:  11/06/23  Consult requested by: Maurice Small, MD (Inactive)   Subjective:   Kathryn Armstrong (DOB: [birth year]) is a 53 y.o. female who presents to the clinic on 11/06/2023 with a chief complaint of Advice Only (Consult) .    History obtained from: chart review and patient.    Grew up in [place] and had no issues with allergies Late in high school/college moved to [place] and then [place]. This was around 1993, and started having post nasal drip, lots of clear mucous in back of throat and nasal cavity, cough due to drainage, frequent throat clearing, raspy voice.   1999 had skin test with Allergist; positive for dust mites and mold.  Worse symptoms in Fall  Using nasal irrigation, Flonase, Claritin in the past.  Currently trying Flonase Sensimist and Astepro for a month without much relief.    Seen [facility] who recommend speech therapy without much improvement and she did not really like the therapy.  Seen [facility] who did not think GERD was contributing as EGD was unremarkable; Also tried Nexium for possible reflux without improvement.  Reviewed:  09/27/2023: seen by [facility] for migraines, meningioma, paresthesia, sleep disturbance. On topamax and triptan with motrin PRN.  08/27/2023: seen by [facility] for multiple meningioma   06/28/2023: seen by [facility] Dr Noel Gerold for hoarseness, throat clearing, raspy voice, feeling of something in throat or throat closing, post nasal drip.  Tried Zyrtec/Nasonex/Nasal Saline. Discussed therapy to desensitize throat with hydration, minimize throat clearing.  Also recommended speech therapy.    Scope 06/2023: Nasopharynx: Clear.  Hypopharynx: No masses or abnormalities. No signs of reflux or post-cricoid edema. Larynx: Vocal folds are mobile for AB-duction and AD-duction. Good symmetric periodic vibration of the vocal cords is noted. Bilateral vocal fold edema and L varix and L  arytenoid granuloma.    EGD 06/05/2023: Impression: - Mild compression in the upper third of the esophagus  from thoracic spine. - Biopsies were taken with a cold forceps for  evaluation of eosinophilic esophagitis. - Short segment of salmon-colored mucosa suspicious  for Barrett's esophagus. Biopsied. - Small gastric polyps. Biopsied. - Erythematous duodenopathy. Biopsied.   Pathology A. Duodenum, endoscopic biopsy:   Duodenal mucosa with no significant pathologic finding. No villous blunting or intraepithelial lymphocytosis is seen.   B. Stomach polyp, endoscopic biopsy:   Fundic gland polyp(s).   C. Esophagus, distal, 41 cm, endoscopic biopsy:   Squamocolumnar mucosa with no significant histopatholgoic finding. Negative for intestinal metaplasia or dysplasia.   D. Esophagus, distal, endoscopic biopsy:   Esophageal squamous mucosa with no significant pathologic finding.   E. Esophagus, proximal, endoscopic biopsy:   Esophageal squamous mucosa with no significant pathologic finding.  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   Past Surgical History: Past Surgical History:  Procedure Laterality Date   RECONSTRUCTIVE RHINOPLASTY  [id number]   TYMPANOSTOMY TUBE PLACEMENT      Family History: Family History  Problem Relation Age of Onset   Colon cancer Father    Heart disease Father    Skin cancer Maternal Aunt    Parkinsonism Paternal Grandfather     Social History:  Flooring in bedroom: wood Pets: cat Tobacco use/exposure: none Job: Herbalist   Medication List:  Allergies as of 11/06/2023       Reactions  Meperidine Other (See Comments)   Hyperactivity at 16 or 17 per patient        Medication List        Accurate as of November 06, 2023 10:00 AM. If you have any questions, ask your nurse or doctor.          STOP  taking these medications    goserelin 3.6 MG injection Commonly known as: ZOLADEX Stopped by: Birder Robson   Horizant 600 MG Tbcr Generic drug: Gabapentin Enacarbil Stopped by: Birder Robson       TAKE these medications    butalbital-acetaminophen-caffeine 50-325-40 MG tablet Commonly known as: FIORICET Take 1 tablet by mouth every 8 (eight) hours as needed.   Comirnaty syringe Generic drug: COVID-19 mRNA vaccine (Pfizer) Inject 0.3 mLs into the muscle once.   cyanocobalamin 100 MCG tablet Take 100 mcg by mouth daily.   dexamethasone 0.5 MG tablet Commonly known as: DECADRON Take 0.5 mg by mouth as needed.   Flulaval 0.5 ML injection Generic drug: influenza vac split trivalent PF Inject 0.5 mLs into the muscle once.   naratriptan 2.5 MG tablet Commonly known as: AMERGE Take 2.5 mg by mouth as needed for migraine.   topiramate 25 MG tablet Commonly known as: TOPAMAX Take 25 mg by mouth daily as needed.         REVIEW OF SYSTEMS: Pertinent positives and negatives discussed in HPI.   Objective:   Physical Exam: BP 100/68 (BP Location: Right Arm, Patient Position: Sitting, Cuff Size: Normal)   Pulse 68   Temp 98 F (36.7 C) (Temporal)   Resp 16   Ht 5\' 9"  (1.753 m)   Wt 144 lb 9.6 oz (65.6 kg)   SpO2 100%   BMI 21.35 kg/m  Body mass index is 21.35 kg/m. GEN: alert, well developed HEENT: clear conjunctiva, nose with + mild inferior turbinate hypertrophy, pink nasal mucosa, no rhinorrhea, + cobblestoning HEART: regular rate and rhythm, no murmur LUNGS: clear to auscultation bilaterally, no coughing, unlabored respiration ABDOMEN: soft, non distended  SKIN: no rashes or lesions  Assessment:   1. Other allergic rhinitis   2. Post-nasal drip   3. Globus sensation   4. Dysphonia     Plan/Recommendations:   Other Allergic Rhinitis: Post Nasal Drip  Dysphonia/Globus Sensation - Due to turbinate hypertrophy, frequent post nasal drip and  unresponsive to over the counter meds, will perform skin testing to identify aeroallergen triggers.  - Has seen [facility] who has recommend speech therapy.  GI performed EGD that was unremarkable, has not responded to PPI.  - Use nasal saline rinses before nose sprays such as with Neilmed Sinus Rinse.  Use distilled water.   - Use Flonase Sensimist 2 sprays each nostril daily. Aim upward and outward. - Use Azelastine 2 sprays each nostril twice daily as needed for runny nose, drainage, sneezing, congestion. Aim upward and outward.   Hold all anti-histamines (Azelastine Xyzal, Allegra, Zyrtec, Claritin, Benadryl, Pepcid) 3 days prior to next visit.  Follow up: 4/2 at 9 AM for skin testing 1-55    Alesia Morin, MD [facility]

## 2023-11-06 NOTE — Patient Instructions (Addendum)
 Rhinitis Post Nasal Drip  - Use nasal saline rinses before nose sprays such as with Neilmed Sinus Rinse.  Use distilled water.   - Use Flonase Sensimist 2 sprays each nostril daily. Aim upward and outward. - Use Azelastine 2 sprays each nostril twice daily as needed for runny nose, drainage, sneezing, congestion. Aim upward and outward.   Hold all anti-histamines (Azelastine Xyzal, Allegra, Zyrtec, Claritin, Benadryl, Pepcid) 3 days prior to next visit.  Follow up: 4/2 at 9 AM

## 2023-11-13 ENCOUNTER — Ambulatory Visit (INDEPENDENT_AMBULATORY_CARE_PROVIDER_SITE_OTHER): Admitting: Internal Medicine

## 2023-11-13 DIAGNOSIS — F5101 Primary insomnia: Secondary | ICD-10-CM | POA: Diagnosis not present

## 2023-11-13 DIAGNOSIS — J3089 Other allergic rhinitis: Secondary | ICD-10-CM

## 2023-11-13 DIAGNOSIS — R09A2 Foreign body sensation, throat: Secondary | ICD-10-CM | POA: Diagnosis not present

## 2023-11-13 NOTE — Progress Notes (Signed)
 Histamine was non reactive. Option given for blood testing but wishes to do skin. Discussed holding all anti histamines for 3 days prior to next visit.

## 2023-11-13 NOTE — Patient Instructions (Addendum)
 Other Allergic Rhinitis: Post Nasal Drip  Dysphonia/Globus Sensation - Due to turbinate hypertrophy, frequent post nasal drip and unresponsive to over the counter meds, will perform skin testing to identify aeroallergen triggers.  - Has seen [facility] who has recommend speech therapy.  GI performed EGD that was unremarkable, has not responded to PPI.  - SPT 11/2023: positive to  - Use nasal saline rinses before nose sprays such as with Neilmed Sinus Rinse.  Use distilled water.   - Use Flonase Sensimist 2 sprays each nostril daily. Aim upward and outward. - Use Azelastine 2 sprays each nostril twice daily as needed for runny nose, drainage, sneezing, congestion. Aim upward and outward. - Use Ipratroprium 1-2 sprays up to four times daily as needed for runny nose. Aim upward and outward. - Use Zyrtec 10 mg daily.  - Consider allergy shots as long term control of your symptoms by teaching your immune system to be more tolerant of your allergy triggers

## 2023-11-22 ENCOUNTER — Ambulatory Visit (INDEPENDENT_AMBULATORY_CARE_PROVIDER_SITE_OTHER): Admitting: Internal Medicine

## 2023-11-22 DIAGNOSIS — J3089 Other allergic rhinitis: Secondary | ICD-10-CM | POA: Diagnosis not present

## 2023-11-22 NOTE — Progress Notes (Signed)
 Non reactive histamine despite holding allergy meds.  Will obtain blood testing.

## 2023-11-22 NOTE — Patient Instructions (Signed)
 Other Allergic Rhinitis: Post Nasal Drip  Dysphonia/Globus Sensation - Due to turbinate hypertrophy, frequent post nasal drip and unresponsive to over the counter meds, will perform skin testing to identify aeroallergen triggers.  - Has seen [facility] who has recommend speech therapy.  GI performed EGD that was unremarkable, has not responded to PPI.  - Use nasal saline rinses before nose sprays such as with Neilmed Sinus Rinse.  Use distilled water.   - Use Flonase Sensimist 2 sprays each nostril daily. Aim upward and outward. - Use Azelastine 2 sprays each nostril twice daily as needed for runny nose, drainage, sneezing, congestion. Aim upward and outward. - Use Zyrtec 10mg  daily

## 2023-12-02 ENCOUNTER — Telehealth: Payer: Self-pay

## 2023-12-02 ENCOUNTER — Telehealth: Payer: Self-pay | Admitting: Internal Medicine

## 2023-12-02 LAB — ALLERGENS W/TOTAL IGE AREA 2

## 2023-12-02 NOTE — Telephone Encounter (Signed)
 Patient called and had questions about her lab results and requested a call back.

## 2023-12-02 NOTE — Telephone Encounter (Addendum)
 Spoke with the patient--DOB verified--informed of test results. Verbalized understanding. No questions or concerns.   ----- Message from Kandice Orleans sent at 12/02/2023  8:30 AM EDT ----- Please let patient know her environmental allergy testing was negative.  I do not think allergies are driving her symptoms.  If she would like to redo the skin testing in future, would just need to call back to reschedule but I would wait several months because her histamine has been non reactive the last two times we tried.

## 2023-12-02 NOTE — Telephone Encounter (Signed)
 Patient called back stating she now has questions regarding her lab results. Patient would like a call back.

## 2023-12-06 DIAGNOSIS — Z853 Personal history of malignant neoplasm of breast: Secondary | ICD-10-CM | POA: Diagnosis not present

## 2023-12-06 DIAGNOSIS — Z08 Encounter for follow-up examination after completed treatment for malignant neoplasm: Secondary | ICD-10-CM | POA: Diagnosis not present

## 2023-12-06 DIAGNOSIS — Z9013 Acquired absence of bilateral breasts and nipples: Secondary | ICD-10-CM | POA: Diagnosis not present

## 2023-12-06 DIAGNOSIS — N939 Abnormal uterine and vaginal bleeding, unspecified: Secondary | ICD-10-CM | POA: Diagnosis not present

## 2023-12-12 DIAGNOSIS — H532 Diplopia: Secondary | ICD-10-CM | POA: Diagnosis not present

## 2023-12-16 DIAGNOSIS — D329 Benign neoplasm of meninges, unspecified: Secondary | ICD-10-CM | POA: Diagnosis not present

## 2023-12-16 DIAGNOSIS — R2689 Other abnormalities of gait and mobility: Secondary | ICD-10-CM | POA: Diagnosis not present

## 2023-12-20 DIAGNOSIS — R1310 Dysphagia, unspecified: Secondary | ICD-10-CM | POA: Diagnosis not present

## 2023-12-20 DIAGNOSIS — R09A2 Foreign body sensation, throat: Secondary | ICD-10-CM | POA: Diagnosis not present

## 2023-12-20 DIAGNOSIS — J383 Other diseases of vocal cords: Secondary | ICD-10-CM | POA: Diagnosis not present

## 2023-12-20 DIAGNOSIS — J384 Edema of larynx: Secondary | ICD-10-CM | POA: Diagnosis not present

## 2023-12-20 DIAGNOSIS — R053 Chronic cough: Secondary | ICD-10-CM | POA: Diagnosis not present

## 2023-12-20 DIAGNOSIS — R490 Dysphonia: Secondary | ICD-10-CM | POA: Diagnosis not present

## 2024-01-14 DIAGNOSIS — R053 Chronic cough: Secondary | ICD-10-CM | POA: Diagnosis not present

## 2024-01-14 DIAGNOSIS — J384 Edema of larynx: Secondary | ICD-10-CM | POA: Diagnosis not present

## 2024-01-14 DIAGNOSIS — R09A2 Foreign body sensation, throat: Secondary | ICD-10-CM | POA: Diagnosis not present

## 2024-01-14 DIAGNOSIS — R490 Dysphonia: Secondary | ICD-10-CM | POA: Diagnosis not present

## 2024-01-28 DIAGNOSIS — Z01419 Encounter for gynecological examination (general) (routine) without abnormal findings: Secondary | ICD-10-CM | POA: Diagnosis not present

## 2024-01-28 DIAGNOSIS — R0989 Other specified symptoms and signs involving the circulatory and respiratory systems: Secondary | ICD-10-CM | POA: Diagnosis not present

## 2024-01-28 DIAGNOSIS — N924 Excessive bleeding in the premenopausal period: Secondary | ICD-10-CM | POA: Diagnosis not present

## 2024-01-28 DIAGNOSIS — Z1231 Encounter for screening mammogram for malignant neoplasm of breast: Secondary | ICD-10-CM | POA: Diagnosis not present

## 2024-01-28 DIAGNOSIS — R09A2 Foreign body sensation, throat: Secondary | ICD-10-CM | POA: Diagnosis not present

## 2024-01-28 DIAGNOSIS — R1310 Dysphagia, unspecified: Secondary | ICD-10-CM | POA: Diagnosis not present

## 2024-01-28 DIAGNOSIS — R490 Dysphonia: Secondary | ICD-10-CM | POA: Diagnosis not present

## 2024-02-04 DIAGNOSIS — F900 Attention-deficit hyperactivity disorder, predominantly inattentive type: Secondary | ICD-10-CM | POA: Diagnosis not present

## 2024-02-04 DIAGNOSIS — F411 Generalized anxiety disorder: Secondary | ICD-10-CM | POA: Diagnosis not present

## 2024-02-05 DIAGNOSIS — F900 Attention-deficit hyperactivity disorder, predominantly inattentive type: Secondary | ICD-10-CM | POA: Diagnosis not present

## 2024-02-05 DIAGNOSIS — F411 Generalized anxiety disorder: Secondary | ICD-10-CM | POA: Diagnosis not present

## 2024-02-06 DIAGNOSIS — F901 Attention-deficit hyperactivity disorder, predominantly hyperactive type: Secondary | ICD-10-CM | POA: Diagnosis not present

## 2024-02-06 DIAGNOSIS — F411 Generalized anxiety disorder: Secondary | ICD-10-CM | POA: Diagnosis not present

## 2024-02-20 DIAGNOSIS — R1310 Dysphagia, unspecified: Secondary | ICD-10-CM | POA: Diagnosis not present

## 2024-02-21 DIAGNOSIS — F840 Autistic disorder: Secondary | ICD-10-CM | POA: Diagnosis not present

## 2024-02-21 DIAGNOSIS — F411 Generalized anxiety disorder: Secondary | ICD-10-CM | POA: Diagnosis not present

## 2024-02-21 DIAGNOSIS — F900 Attention-deficit hyperactivity disorder, predominantly inattentive type: Secondary | ICD-10-CM | POA: Diagnosis not present

## 2024-02-25 DIAGNOSIS — C50811 Malignant neoplasm of overlapping sites of right female breast: Secondary | ICD-10-CM | POA: Diagnosis not present

## 2024-02-25 DIAGNOSIS — D429 Neoplasm of uncertain behavior of meninges, unspecified: Secondary | ICD-10-CM | POA: Diagnosis not present

## 2024-02-25 DIAGNOSIS — D329 Benign neoplasm of meninges, unspecified: Secondary | ICD-10-CM | POA: Diagnosis not present

## 2024-02-25 DIAGNOSIS — D320 Benign neoplasm of cerebral meninges: Secondary | ICD-10-CM | POA: Diagnosis not present

## 2024-03-03 DIAGNOSIS — D329 Benign neoplasm of meninges, unspecified: Secondary | ICD-10-CM | POA: Diagnosis not present

## 2024-03-04 DIAGNOSIS — F840 Autistic disorder: Secondary | ICD-10-CM | POA: Diagnosis not present

## 2024-03-04 DIAGNOSIS — F411 Generalized anxiety disorder: Secondary | ICD-10-CM | POA: Diagnosis not present

## 2024-03-04 DIAGNOSIS — F900 Attention-deficit hyperactivity disorder, predominantly inattentive type: Secondary | ICD-10-CM | POA: Diagnosis not present

## 2024-03-11 DIAGNOSIS — R09A2 Foreign body sensation, throat: Secondary | ICD-10-CM | POA: Diagnosis not present

## 2024-03-11 DIAGNOSIS — R0989 Other specified symptoms and signs involving the circulatory and respiratory systems: Secondary | ICD-10-CM | POA: Diagnosis not present

## 2024-03-11 DIAGNOSIS — R490 Dysphonia: Secondary | ICD-10-CM | POA: Diagnosis not present

## 2024-03-11 DIAGNOSIS — R1310 Dysphagia, unspecified: Secondary | ICD-10-CM | POA: Diagnosis not present

## 2024-03-23 DIAGNOSIS — F411 Generalized anxiety disorder: Secondary | ICD-10-CM | POA: Diagnosis not present

## 2024-03-23 DIAGNOSIS — F840 Autistic disorder: Secondary | ICD-10-CM | POA: Diagnosis not present

## 2024-03-23 DIAGNOSIS — F900 Attention-deficit hyperactivity disorder, predominantly inattentive type: Secondary | ICD-10-CM | POA: Diagnosis not present

## 2024-06-09 DIAGNOSIS — G43719 Chronic migraine without aura, intractable, without status migrainosus: Secondary | ICD-10-CM | POA: Diagnosis not present

## 2024-06-25 ENCOUNTER — Ambulatory Visit (INDEPENDENT_AMBULATORY_CARE_PROVIDER_SITE_OTHER): Admitting: Orthopaedic Surgery

## 2024-06-25 DIAGNOSIS — M79642 Pain in left hand: Secondary | ICD-10-CM | POA: Diagnosis not present

## 2024-06-25 MED ORDER — MELOXICAM 15 MG PO TABS: 15.0000 mg | ORAL_TABLET | Freq: Every day | ORAL | 2 refills | Status: DC

## 2024-06-25 NOTE — Progress Notes (Signed)
 Office Visit Note   Patient: Kathryn Armstrong           Date of Birth: 22-Dec-1970           MRN: [id number] Visit Date: 06/25/2024              Requested by: Auston Opal, DO [address] [place],  [place] [address] PCP: Auston Opal, DO   Assessment & Plan: Visit Diagnoses:  1. Left hand pain     Plan: History of Present Illness Teegan Guinther is a 53 year old female who presents with joint pain and hypermobility concerns.  She experiences pain in her left thumb knuckle for several weeks, originating from the joint or muscle, with a sensation similar to a 'trigger point' or cramping. Massage and manipulation have not provided relief. The pain does not cause the finger to lock.  Sharp pain in her right wrist began in June after lifting a heavy pot. Despite icing and using a bandage for support, the pain persists months later. Occasionally, similar symptoms occur in her left wrist.  She describes a generalized feeling of her body 'falling apart,' with issues in her neck, knees, and ankles. There is a crunching sound in her neck and stiffness after being in the same position for short periods, which has worsened over the past few months.  She has a history of being 'double jointed' as a child and suspects a connective tissue disorder like Ehlers-Danlos syndrome. She experiences easy bruising with minimal trauma, resulting in significant pain and bruising, particularly on her hands.  Physical Exam MUSCULOSKELETAL: Examination of the left thumb shows excellent range of motion.  No significant pain with CMC grind test.  There is no triggering.  Collateral ligaments are stable.  No swelling.  No neurovascular compromise.  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 Differential includes arthritis or inflammation at the base of the [facility] joint. -  Prescribed meloxicam for two weeks to assess response to NSAID therapy. - Recommended use of a thumb CMC brace to support the base of the thumb. - Advised taking meloxicam with food and possibly with an antacid to minimize gastrointestinal side effects.  Right wrist pain, chronic since injury Chronic right wrist pain persisting since an injury in June. No acute structural abnormalities noted. - Prescribed meloxicam for two weeks to assess response to NSAID therapy.  Generalized joint pain and stiffness Generalized joint pain and stiffness affecting multiple joints. Possible underlying connective tissue disorder considered, but no definitive diagnosis made. - Ordered screening blood work to evaluate for underlying connective tissue disorders. - Prescribed meloxicam for two weeks to assess response to NSAID therapy. - Suspicious for connective tissue disorder, will obtain arthritis panel today  Follow-Up Instructions: No follow-ups on file.   Orders:  Orders Placed This Encounter  Procedures   Uric acid   Rheumatoid Factor   Sed Rate (ESR)   Antinuclear Antib (ANA)   Meds ordered this encounter  Medications   meloxicam (MOBIC) 15 MG tablet    Sig: Take 1 tablet (15 mg total) by mouth daily.    Dispense:  14 tablet    Refill:  2      Procedures: No procedures performed   Clinical Data: No additional findings.   Subjective: Chief Complaint  Patient presents with   Left Hand - Pain    thumb  Right Wrist - Pain    HPI  Review of Systems  Constitutional: Negative.   HENT: Negative.    Eyes: Negative.   Respiratory: Negative.    Cardiovascular: Negative.   Endocrine: Negative.   Musculoskeletal: Negative.   Neurological: Negative.   Hematological: Negative.   Psychiatric/Behavioral: Negative.    All other systems reviewed and are negative.    Objective: Vital Signs: There were no vitals taken for this visit.  Physical Exam Vitals and nursing note  reviewed.  Constitutional:      Appearance: She is well-developed.  HENT:     Head: Atraumatic.     Nose: Nose normal.  Eyes:     Extraocular Movements: Extraocular movements intact.  Cardiovascular:     Pulses: Normal pulses.  Pulmonary:     Effort: Pulmonary effort is normal.  Abdominal:     Palpations: Abdomen is soft.  Musculoskeletal:     Cervical back: Neck supple.  Skin:    General: Skin is warm.     Capillary Refill: Capillary refill takes less than 2 seconds.  Neurological:     Mental Status: She is alert. Mental status is at baseline.  Psychiatric:        Behavior: Behavior normal.        Thought Content: Thought content normal.        Judgment: Judgment normal.     Ortho Exam  Specialty Comments:  No specialty comments available.  Imaging: No results found.   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    Family History  Problem Relation Age of Onset   Colon cancer Father    Heart disease Father    Skin cancer Maternal Aunt    Parkinsonism Paternal Grandfather     Past Surgical History:  Procedure Laterality Date   RECONSTRUCTIVE RHINOPLASTY  [id number]   TYMPANOSTOMY TUBE PLACEMENT     Social History   Occupational History   Occupation: MARKETING    Comment: [employer]  Tobacco Use   Smoking status: Never    Passive exposure: Never   Smokeless tobacco: Never  Vaping Use   Vaping status: Never Used  Substance and Sexual Activity   Alcohol use: Yes    Comment: seldom   Drug use: No   Sexual activity: Not on file

## 2024-06-26 LAB — SEDIMENTATION RATE: Sed Rate: 2 mm/h (ref 0–30)

## 2024-06-26 LAB — URIC ACID: Uric Acid, Serum: 4.7 mg/dL (ref 2.5–7.0)

## 2024-06-26 LAB — RHEUMATOID FACTOR: Rheumatoid fact SerPl-aCnc: <10 [IU]/mL (ref <14)

## 2024-06-26 LAB — ANA: Anti Nuclear Antibody (ANA): NEGATIVE

## 2024-06-29 ENCOUNTER — Ambulatory Visit: Payer: Self-pay | Admitting: Orthopaedic Surgery

## 2024-07-23 DIAGNOSIS — Z131 Encounter for screening for diabetes mellitus: Secondary | ICD-10-CM | POA: Diagnosis not present

## 2024-07-23 DIAGNOSIS — Z0000 Encounter for general adult medical examination without abnormal findings: Secondary | ICD-10-CM | POA: Diagnosis not present

## 2024-07-23 DIAGNOSIS — M2550 Pain in unspecified joint: Secondary | ICD-10-CM | POA: Diagnosis not present

## 2024-07-23 DIAGNOSIS — F5101 Primary insomnia: Secondary | ICD-10-CM | POA: Diagnosis not present

## 2024-07-23 DIAGNOSIS — E559 Vitamin D deficiency, unspecified: Secondary | ICD-10-CM | POA: Diagnosis not present

## 2024-07-23 DIAGNOSIS — E782 Mixed hyperlipidemia: Secondary | ICD-10-CM | POA: Diagnosis not present

## 2024-08-04 ENCOUNTER — Other Ambulatory Visit: Payer: Self-pay | Admitting: Orthopaedic Surgery
